# Patient Record
Sex: Female | Born: 1995 | Race: Black or African American | Hispanic: No | Smoking: Never smoker
Health system: Southern US, Community
[De-identification: ages and names within clinical notes are randomized; demographics above are authoritative.]

## PROBLEM LIST (undated history)

## (undated) DIAGNOSIS — J029 Acute pharyngitis, unspecified: Secondary | ICD-10-CM

## (undated) DIAGNOSIS — R079 Chest pain, unspecified: Secondary | ICD-10-CM

## (undated) DIAGNOSIS — M940 Chondrocostal junction syndrome [Tietze]: Secondary | ICD-10-CM

## (undated) DIAGNOSIS — J020 Streptococcal pharyngitis: Secondary | ICD-10-CM

## (undated) DIAGNOSIS — B354 Tinea corporis: Secondary | ICD-10-CM

## (undated) DIAGNOSIS — H6002 Abscess of left external ear: Secondary | ICD-10-CM

## (undated) DIAGNOSIS — R109 Unspecified abdominal pain: Secondary | ICD-10-CM

## (undated) DIAGNOSIS — R1013 Epigastric pain: Secondary | ICD-10-CM

## (undated) DIAGNOSIS — N946 Dysmenorrhea, unspecified: Secondary | ICD-10-CM

## (undated) DIAGNOSIS — M25519 Pain in unspecified shoulder: Secondary | ICD-10-CM

## (undated) DIAGNOSIS — R21 Rash and other nonspecific skin eruption: Secondary | ICD-10-CM

## (undated) DIAGNOSIS — R0789 Other chest pain: Secondary | ICD-10-CM

## (undated) DIAGNOSIS — R55 Syncope and collapse: Secondary | ICD-10-CM

## (undated) DIAGNOSIS — D508 Other iron deficiency anemias: Secondary | ICD-10-CM

## (undated) DIAGNOSIS — R519 Headache, unspecified: Secondary | ICD-10-CM

## (undated) DIAGNOSIS — K2900 Acute gastritis without bleeding: Secondary | ICD-10-CM

## (undated) HISTORY — DX: Rash and other nonspecific skin eruption: R21

## (undated) HISTORY — DX: Acute gastritis without bleeding: K29.00

## (undated) HISTORY — DX: Abscess of left external ear: H60.02

## (undated) HISTORY — DX: Epigastric pain: R10.13

## (undated) HISTORY — DX: Dysmenorrhea, unspecified: N94.6

## (undated) HISTORY — DX: Syncope and collapse: R55

## (undated) HISTORY — DX: Unspecified abdominal pain: R10.9

## (undated) HISTORY — DX: Chest pain, unspecified: R07.9

## (undated) HISTORY — DX: Chondrocostal junction syndrome (tietze): M94.0

## (undated) HISTORY — DX: Tinea corporis: B35.4

## (undated) HISTORY — DX: Other iron deficiency anemias: D50.8

## (undated) HISTORY — DX: Streptococcal pharyngitis: J02.0

## (undated) HISTORY — DX: Headache, unspecified: R51.9

## (undated) HISTORY — DX: Acute pharyngitis, unspecified: J02.9

## (undated) HISTORY — DX: Pain in unspecified shoulder: M25.519

## (undated) HISTORY — DX: Other chest pain: R07.89

---

## 2018-05-11 DIAGNOSIS — R1084 Generalized abdominal pain: Secondary | ICD-10-CM | POA: Diagnosis not present

## 2018-05-11 DIAGNOSIS — O26891 Other specified pregnancy related conditions, first trimester: Secondary | ICD-10-CM | POA: Diagnosis not present

## 2018-05-11 DIAGNOSIS — Z3A11 11 weeks gestation of pregnancy: Secondary | ICD-10-CM | POA: Diagnosis not present

## 2018-05-11 DIAGNOSIS — O98311 Other infections with a predominantly sexual mode of transmission complicating pregnancy, first trimester: Secondary | ICD-10-CM | POA: Diagnosis not present

## 2018-05-31 DIAGNOSIS — Z3A14 14 weeks gestation of pregnancy: Secondary | ICD-10-CM | POA: Diagnosis not present

## 2018-05-31 DIAGNOSIS — O3680X0 Pregnancy with inconclusive fetal viability, not applicable or unspecified: Secondary | ICD-10-CM | POA: Diagnosis not present

## 2018-05-31 DIAGNOSIS — Z3482 Encounter for supervision of other normal pregnancy, second trimester: Secondary | ICD-10-CM | POA: Diagnosis not present

## 2018-06-28 DIAGNOSIS — Z3482 Encounter for supervision of other normal pregnancy, second trimester: Secondary | ICD-10-CM | POA: Diagnosis not present

## 2018-06-28 DIAGNOSIS — Z3A18 18 weeks gestation of pregnancy: Secondary | ICD-10-CM | POA: Diagnosis not present

## 2018-07-13 DIAGNOSIS — R21 Rash and other nonspecific skin eruption: Secondary | ICD-10-CM | POA: Diagnosis not present

## 2018-07-13 DIAGNOSIS — L539 Erythematous condition, unspecified: Secondary | ICD-10-CM | POA: Diagnosis not present

## 2018-07-13 DIAGNOSIS — B354 Tinea corporis: Secondary | ICD-10-CM | POA: Diagnosis not present

## 2018-08-08 DIAGNOSIS — O26892 Other specified pregnancy related conditions, second trimester: Secondary | ICD-10-CM | POA: Diagnosis not present

## 2018-08-08 DIAGNOSIS — O2342 Unspecified infection of urinary tract in pregnancy, second trimester: Secondary | ICD-10-CM | POA: Diagnosis not present

## 2018-08-08 DIAGNOSIS — K5909 Other constipation: Secondary | ICD-10-CM | POA: Diagnosis not present

## 2018-08-08 DIAGNOSIS — R109 Unspecified abdominal pain: Secondary | ICD-10-CM | POA: Diagnosis not present

## 2018-08-08 DIAGNOSIS — B9689 Other specified bacterial agents as the cause of diseases classified elsewhere: Secondary | ICD-10-CM | POA: Diagnosis not present

## 2018-08-08 DIAGNOSIS — N760 Acute vaginitis: Secondary | ICD-10-CM | POA: Diagnosis not present

## 2018-08-08 DIAGNOSIS — Z3A24 24 weeks gestation of pregnancy: Secondary | ICD-10-CM | POA: Diagnosis not present

## 2018-08-11 DIAGNOSIS — B360 Pityriasis versicolor: Secondary | ICD-10-CM | POA: Diagnosis not present

## 2018-08-11 DIAGNOSIS — Z3A25 25 weeks gestation of pregnancy: Secondary | ICD-10-CM | POA: Diagnosis not present

## 2018-08-11 DIAGNOSIS — Z3482 Encounter for supervision of other normal pregnancy, second trimester: Secondary | ICD-10-CM | POA: Diagnosis not present

## 2018-08-18 DIAGNOSIS — H9201 Otalgia, right ear: Secondary | ICD-10-CM | POA: Diagnosis not present

## 2018-08-18 DIAGNOSIS — H6001 Abscess of right external ear: Secondary | ICD-10-CM | POA: Diagnosis not present

## 2018-08-23 DIAGNOSIS — Z3A26 26 weeks gestation of pregnancy: Secondary | ICD-10-CM | POA: Diagnosis not present

## 2018-08-23 DIAGNOSIS — Z3482 Encounter for supervision of other normal pregnancy, second trimester: Secondary | ICD-10-CM | POA: Diagnosis not present

## 2018-09-06 DIAGNOSIS — B373 Candidiasis of vulva and vagina: Secondary | ICD-10-CM | POA: Diagnosis not present

## 2018-09-06 DIAGNOSIS — O4693 Antepartum hemorrhage, unspecified, third trimester: Secondary | ICD-10-CM | POA: Diagnosis not present

## 2018-09-06 DIAGNOSIS — Z3483 Encounter for supervision of other normal pregnancy, third trimester: Secondary | ICD-10-CM | POA: Diagnosis not present

## 2018-09-06 DIAGNOSIS — R102 Pelvic and perineal pain: Secondary | ICD-10-CM | POA: Diagnosis not present

## 2018-09-06 DIAGNOSIS — O26899 Other specified pregnancy related conditions, unspecified trimester: Secondary | ICD-10-CM | POA: Diagnosis not present

## 2018-09-06 DIAGNOSIS — Z3482 Encounter for supervision of other normal pregnancy, second trimester: Secondary | ICD-10-CM | POA: Diagnosis not present

## 2018-09-06 DIAGNOSIS — O26893 Other specified pregnancy related conditions, third trimester: Secondary | ICD-10-CM | POA: Diagnosis not present

## 2018-09-11 DIAGNOSIS — R110 Nausea: Secondary | ICD-10-CM | POA: Diagnosis not present

## 2018-09-11 DIAGNOSIS — O210 Mild hyperemesis gravidarum: Secondary | ICD-10-CM | POA: Diagnosis not present

## 2018-09-11 DIAGNOSIS — R197 Diarrhea, unspecified: Secondary | ICD-10-CM | POA: Diagnosis not present

## 2018-09-11 DIAGNOSIS — Z3A28 28 weeks gestation of pregnancy: Secondary | ICD-10-CM | POA: Diagnosis not present

## 2018-09-11 DIAGNOSIS — R109 Unspecified abdominal pain: Secondary | ICD-10-CM | POA: Diagnosis not present

## 2018-09-11 DIAGNOSIS — R1030 Lower abdominal pain, unspecified: Secondary | ICD-10-CM | POA: Diagnosis not present

## 2018-09-11 DIAGNOSIS — O9989 Other specified diseases and conditions complicating pregnancy, childbirth and the puerperium: Secondary | ICD-10-CM | POA: Diagnosis not present

## 2018-09-11 DIAGNOSIS — O26893 Other specified pregnancy related conditions, third trimester: Secondary | ICD-10-CM | POA: Diagnosis not present

## 2018-09-11 DIAGNOSIS — E860 Dehydration: Secondary | ICD-10-CM | POA: Diagnosis not present

## 2018-09-11 DIAGNOSIS — Z87891 Personal history of nicotine dependence: Secondary | ICD-10-CM | POA: Diagnosis not present

## 2018-09-11 DIAGNOSIS — O219 Vomiting of pregnancy, unspecified: Secondary | ICD-10-CM | POA: Diagnosis not present

## 2018-09-28 DIAGNOSIS — Z3483 Encounter for supervision of other normal pregnancy, third trimester: Secondary | ICD-10-CM | POA: Diagnosis not present

## 2018-09-28 DIAGNOSIS — Z3A31 31 weeks gestation of pregnancy: Secondary | ICD-10-CM | POA: Diagnosis not present

## 2018-09-28 DIAGNOSIS — Z23 Encounter for immunization: Secondary | ICD-10-CM | POA: Diagnosis not present

## 2018-10-11 DIAGNOSIS — O283 Abnormal ultrasonic finding on antenatal screening of mother: Secondary | ICD-10-CM | POA: Diagnosis not present

## 2018-10-11 DIAGNOSIS — D509 Iron deficiency anemia, unspecified: Secondary | ICD-10-CM | POA: Diagnosis not present

## 2018-10-21 DIAGNOSIS — O9989 Other specified diseases and conditions complicating pregnancy, childbirth and the puerperium: Secondary | ICD-10-CM | POA: Diagnosis not present

## 2018-10-21 DIAGNOSIS — O99711 Diseases of the skin and subcutaneous tissue complicating pregnancy, first trimester: Secondary | ICD-10-CM | POA: Diagnosis not present

## 2018-10-21 DIAGNOSIS — L02211 Cutaneous abscess of abdominal wall: Secondary | ICD-10-CM | POA: Diagnosis not present

## 2018-10-21 DIAGNOSIS — Z3A01 Less than 8 weeks gestation of pregnancy: Secondary | ICD-10-CM | POA: Diagnosis not present

## 2018-10-25 DIAGNOSIS — Z3493 Encounter for supervision of normal pregnancy, unspecified, third trimester: Secondary | ICD-10-CM | POA: Diagnosis not present

## 2018-10-25 DIAGNOSIS — D509 Iron deficiency anemia, unspecified: Secondary | ICD-10-CM | POA: Diagnosis not present

## 2018-10-25 DIAGNOSIS — O99013 Anemia complicating pregnancy, third trimester: Secondary | ICD-10-CM | POA: Diagnosis not present

## 2018-11-01 DIAGNOSIS — Z3A00 Weeks of gestation of pregnancy not specified: Secondary | ICD-10-CM | POA: Diagnosis not present

## 2018-11-01 DIAGNOSIS — O99013 Anemia complicating pregnancy, third trimester: Secondary | ICD-10-CM | POA: Diagnosis not present

## 2018-11-01 DIAGNOSIS — D509 Iron deficiency anemia, unspecified: Secondary | ICD-10-CM | POA: Diagnosis not present

## 2018-11-03 HISTORY — PX: TUBAL LIGATION: SHX77

## 2018-11-08 DIAGNOSIS — Z3A37 37 weeks gestation of pregnancy: Secondary | ICD-10-CM | POA: Diagnosis not present

## 2018-11-08 DIAGNOSIS — Z3493 Encounter for supervision of normal pregnancy, unspecified, third trimester: Secondary | ICD-10-CM | POA: Diagnosis not present

## 2018-11-15 DIAGNOSIS — Z3483 Encounter for supervision of other normal pregnancy, third trimester: Secondary | ICD-10-CM | POA: Diagnosis not present

## 2018-11-15 DIAGNOSIS — Z3A38 38 weeks gestation of pregnancy: Secondary | ICD-10-CM | POA: Diagnosis not present

## 2018-11-15 DIAGNOSIS — D509 Iron deficiency anemia, unspecified: Secondary | ICD-10-CM | POA: Diagnosis not present

## 2018-11-15 DIAGNOSIS — O99013 Anemia complicating pregnancy, third trimester: Secondary | ICD-10-CM | POA: Diagnosis not present

## 2018-11-18 DIAGNOSIS — O99013 Anemia complicating pregnancy, third trimester: Secondary | ICD-10-CM | POA: Diagnosis not present

## 2018-11-18 DIAGNOSIS — D509 Iron deficiency anemia, unspecified: Secondary | ICD-10-CM | POA: Diagnosis not present

## 2018-11-19 DIAGNOSIS — Z302 Encounter for sterilization: Secondary | ICD-10-CM | POA: Diagnosis not present

## 2019-01-13 ENCOUNTER — Encounter: Payer: Self-pay | Admitting: Physician Assistant

## 2019-01-13 ENCOUNTER — Emergency Department
Admission: EM | Admit: 2019-01-13 | Discharge: 2019-01-13 | Disposition: A | Discharge status: Home / Self Care | Payer: Medicaid Other | Attending: Emergency Medicine | Admitting: Emergency Medicine

## 2019-01-13 DIAGNOSIS — J029 Acute pharyngitis, unspecified: Secondary | ICD-10-CM | POA: Diagnosis present

## 2019-01-13 DIAGNOSIS — J020 Streptococcal pharyngitis: Secondary | ICD-10-CM | POA: Insufficient documentation

## 2019-01-13 LAB — GROUP A STREP BY PCR: Group A Strep by PCR: DETECTED — AB

## 2019-01-13 MED ORDER — PENICILLIN G BENZATHINE 1200000 UNIT/2ML IM SUSP
1.2000 10*6.[IU] | Freq: Once | INTRAMUSCULAR | Status: AC
  Administered 2019-01-13: 1.2 10*6.[IU] via INTRAMUSCULAR
  Filled 2019-01-13: qty 2

## 2019-01-13 NOTE — ED Notes (Signed)
Presents with sore throat  States pain started couple of days ago  having increased pain with swallowing today  Unsure of fever  But afebrile on arrival

## 2019-01-13 NOTE — ED Provider Notes (Signed)
[facility] Emergency Department Provider Note ____________________________________________  Time seen: 0823  I have reviewed the triage vital signs and the nursing notes.  HISTORY  Chief Complaint  Sore Throat  HPI Sharon Day is a 23 y.o. female patient presents for evaluation and management of sore throat.  She reports that her mother was recently tested and treated for strep pharyngitis.  She reports onset since Monday.  She denies any fevers but reports pain with talking.  She is also had decreased appetite secondary to her symptoms.  History reviewed. No pertinent past medical history.  There are no active problems to display for this patient.  History reviewed. No pertinent surgical history.  Prior to Admission medications   Not on File    Allergies Patient has no known allergies.  No family history on file.  Social History Social History   Tobacco Use  . Smoking status: Not on file  Substance Use Topics  . Alcohol use: Not on file  . Drug use: Not on file    Review of Systems  Constitutional: Negative for fever. Eyes: Negative for visual changes. ENT: Positive for sore throat. Cardiovascular: Negative for chest pain. Respiratory: Negative for shortness of breath. Gastrointestinal: Negative for abdominal pain, vomiting and diarrhea. Genitourinary: Negative for dysuria. Musculoskeletal: Negative for back pain. Skin: Negative for rash. Neurological: Negative for headaches, focal weakness or numbness. ____________________________________________  PHYSICAL EXAM:  VITAL SIGNS: ED Triage Vitals  Enc Vitals Group     BP      Pulse      Resp      Temp      Temp src      SpO2      Weight      Height      Head Circumference      Peak Flow      Pain Score      Pain Loc      Pain Edu?      Excl. in GC?     Constitutional: Alert and oriented. Well appearing and in no distress. Head: Normocephalic and atraumatic. Eyes:  Conjunctivae are normal.  Normal extraocular movements Ears: Canals clear. TMs intact bilaterally. Nose: No congestion/rhinorrhea/epistaxis. Mouth/Throat: Mucous membranes are moist.  Uvula is midline and tonsils are flat.  There is mild oropharyngeal erythema appreciated. Neck: Supple. No thyromegaly. Hematological/Lymphatic/Immunological: Palpable anterior cervical lymphadenopathy. Cardiovascular: Normal rate, regular rhythm. Normal distal pulses. Respiratory: Normal respiratory effort. No wheezes/rales/rhonchi. Gastrointestinal: Soft and nontender. No distention. ____________________________________________   LABS (pertinent positives/negatives) Labs Reviewed  GROUP A STREP BY PCR - Abnormal; Notable for the following components:      Result Value   Group A Strep by PCR DETECTED (*)    All other components within normal limits  ____________________________________________  PROCEDURES  Procedures Penicillin G benzathine 1,200,000 units IM ____________________________________________  INITIAL IMPRESSION / ASSESSMENT AND PLAN / ED COURSE  Patient with ED evaluation of sore throat with a recent strep positive contact.  Her strep PCR confirms group A strep.  Patient was treated in the ED with a single dose of penicillin G.  She will continue to monitor and treat fevers and hydrate to prevent dehydration.  She is encouraged to follow with primary provider or return to the ED as needed.  Patient was treated and discharged during the epic downtime.  Paper chart was completed.  Generic discharge instructions were provided. ____________________________________________  FINAL CLINICAL IMPRESSION(S) / ED DIAGNOSES  Final diagnoses:  Strep pharyngitis  Lissa Hoard, PA-C 01/13/19 1244    Phineas Semen, MD 01/13/19 [phone]

## 2019-01-13 NOTE — ED Triage Notes (Signed)
See paper chart for computer downtime 

## 2019-06-29 DIAGNOSIS — Z5181 Encounter for therapeutic drug level monitoring: Secondary | ICD-10-CM | POA: Diagnosis not present

## 2019-07-06 DIAGNOSIS — Z5181 Encounter for therapeutic drug level monitoring: Secondary | ICD-10-CM | POA: Diagnosis not present

## 2019-07-14 DIAGNOSIS — Z5181 Encounter for therapeutic drug level monitoring: Secondary | ICD-10-CM | POA: Diagnosis not present

## 2019-07-21 DIAGNOSIS — Z5181 Encounter for therapeutic drug level monitoring: Secondary | ICD-10-CM | POA: Diagnosis not present

## 2019-07-28 DIAGNOSIS — Z5181 Encounter for therapeutic drug level monitoring: Secondary | ICD-10-CM | POA: Diagnosis not present

## 2019-08-03 DIAGNOSIS — Z5181 Encounter for therapeutic drug level monitoring: Secondary | ICD-10-CM | POA: Diagnosis not present

## 2019-08-09 ENCOUNTER — Emergency Department
Admission: EM | Admit: 2019-08-09 | Discharge: 2019-08-09 | Disposition: A | Discharge status: Home / Self Care | Payer: Medicaid Other | Attending: Emergency Medicine | Admitting: Emergency Medicine

## 2019-08-09 ENCOUNTER — Other Ambulatory Visit: Payer: Self-pay

## 2019-08-09 DIAGNOSIS — N946 Dysmenorrhea, unspecified: Secondary | ICD-10-CM | POA: Diagnosis not present

## 2019-08-09 DIAGNOSIS — R1084 Generalized abdominal pain: Secondary | ICD-10-CM | POA: Diagnosis present

## 2019-08-09 LAB — POCT PREGNANCY, URINE: Preg Test, Ur: NEGATIVE

## 2019-08-09 LAB — URINALYSIS, COMPLETE (UACMP) WITH MICROSCOPIC
Bacteria, UA: NONE SEEN
Bilirubin Urine: NEGATIVE
Glucose, UA: NEGATIVE mg/dL
Ketones, ur: NEGATIVE mg/dL
Leukocytes,Ua: NEGATIVE
Nitrite: NEGATIVE
Protein, ur: NEGATIVE mg/dL
RBC / HPF: >50 RBC/hpf — ABNORMAL HIGH (ref 0–5)
Specific Gravity, Urine: 1.015 (ref 1.005–1.030)
pH: 6 (ref 5.0–8.0)

## 2019-08-09 NOTE — ED Triage Notes (Signed)
Patient presents to ED via POV from home with c/o abdominal cramping. Patient is on her menstrual cycle. Patient reports she had a tubal ligation 9 months ago without complications. Denies N/V/D.

## 2019-08-09 NOTE — ED Provider Notes (Signed)
[facility] Emergency Department Provider Note ____________________________________________  Time seen: 1507  I have reviewed the triage vital signs and the nursing notes.  HISTORY  Chief Complaint  Abdominal Cramping  HPI Sharon Day is a 23 y.o. female presents to the ED accompanied by her significant other, for concern over abdominal cramping and vaginal bleeding.  Patient reports onset of her menstrual period yesterday, that she reports as  lighter than usual.  She gives a history of irregular menses, stating she may go several months with monthly cycles and then missed a month.  She reports her last missed menses was in April.  She notes that in January she had a uncomplicated tubal ligation following a normal spontaneous vaginal delivery.  She has had no complaints in the interim of any abnormal bleeding, pelvic pain, fever, chills, sweats.  She reports at this time some mild pelvic cramping that she describes as contractions.  She also reports some anterior thigh muscle pain and some chills.  She denies any fever, or sweats.  She denies any vaginal discharge or other complaints at this time she reports some concern for possible ectopic pregnancy and is here for pregnancy test.  No past medical history on file.  There are no active problems to display for this patient.   No past surgical history on file.  Prior to Admission medications   Not on File    Allergies Patient has no known allergies.  No family history on file.  Social History Social History   Tobacco Use  . Smoking status: Not on file  Substance Use Topics  . Alcohol use: Not on file  . Drug use: Not on file    Review of Systems  Constitutional: Negative for fever. Eyes: Negative for visual changes. ENT: Negative for sore throat. Cardiovascular: Negative for chest pain. Respiratory: Negative for shortness of breath. Gastrointestinal: Negative for abdominal pain, vomiting and  diarrhea. Genitourinary: Negative for dysuria.  Vaginal bleeding as above. Musculoskeletal: Negative for back pain. Skin: Negative for rash. Neurological: Negative for headaches, focal weakness or numbness. ____________________________________________  PHYSICAL EXAM:  VITAL SIGNS: ED Triage Vitals  Enc Vitals Group     BP 08/09/19 1430 125/83     Pulse Rate 08/09/19 1430 65     Resp 08/09/19 1430 15     Temp 08/09/19 1430 98.7 F (37.1 C)     Temp Source 08/09/19 1430 Oral     SpO2 08/09/19 1430 100 %     Weight 08/09/19 1431 113 lb (51.3 kg)     Height 08/09/19 1431 5\' 2"  (1.575 m)     Head Circumference --      Peak Flow --      Pain Score 08/09/19 1431 7     Pain Loc --      Pain Edu? --      Excl. in GC? --     Constitutional: Alert and oriented. Well appearing and in no distress. Head: Normocephalic and atraumatic. Eyes: Conjunctivae are normal. Normal extraocular movements Cardiovascular: Normal rate, regular rhythm. Normal distal pulses. Respiratory: Normal respiratory effort. No wheezes/rales/rhonchi. Gastrointestinal: Soft and nontender. No distention. GU: deferred Musculoskeletal: Nontender with normal range of motion in all extremities.  Neurologic:  Normal gait without ataxia. Normal speech and language. No gross focal neurologic deficits are appreciated. Skin:  Skin is warm, dry and intact. No rash noted. Psychiatric: Mood and affect are normal. Patient exhibits appropriate insight and judgment. ____________________________________________   LABS (pertinent positives/negatives) Labs  Reviewed  URINALYSIS, COMPLETE (UACMP) WITH MICROSCOPIC - Abnormal; Notable for the following components:      Result Value   Color, Urine YELLOW (*)    APPearance CLEAR (*)    Hgb urine dipstick LARGE (*)    RBC / HPF >50 (*)    All other components within normal limits  POC URINE PREG, ED  POCT PREGNANCY, URINE   ____________________________________________  PROCEDURES  Procedures ____________________________________________  INITIAL IMPRESSION / ASSESSMENT AND PLAN / ED COURSE  Patient with ED evaluation and concern of a possible pregnancy.  Patient with a normal exam at this time.  Her pregnancy test is negative and reassuring to her at this time.  She will follow-up with her primary provider or GYN provider for ongoing symptom management.  Sharon Day was evaluated in Emergency Department on 08/10/2019 for the symptoms described in the history of present illness. She was evaluated in the context of the global COVID-19 pandemic, which necessitated consideration that the patient might be at risk for infection with the SARS-CoV-2 virus that causes COVID-19. Institutional protocols and algorithms that pertain to the evaluation of patients at risk for COVID-19 are in a state of rapid change based on information released by regulatory bodies including the CDC and federal and state organizations. These policies and algorithms were followed during the patient's care in the ED. ____________________________________________  FINAL CLINICAL IMPRESSION(S) / ED DIAGNOSES  Final diagnoses:  Dysmenorrhea         Sharon Day, Dannielle Karvonen, PA-C 08/10/19 2306    Duffy Bruce, MD 08/12/19 1310

## 2019-08-09 NOTE — Discharge Instructions (Signed)
Your exam is normal and your pregnancy test is negative. You may take OTC ibuprofen for menstrual cramps and muscle pains. You should also consider downloading the app P-Tracker on your phone to keep track of your menstrual periods. Follow-up with your GYN provider or return as needed.

## 2019-08-09 NOTE — ED Notes (Signed)
See triage note   Presents with abd cramping  States she started her period yesterday and states she usually has cramping  But today she is having abd cramping with some back pain and thigh pain

## 2019-08-10 DIAGNOSIS — Z5181 Encounter for therapeutic drug level monitoring: Secondary | ICD-10-CM | POA: Diagnosis not present

## 2019-08-17 DIAGNOSIS — Z5181 Encounter for therapeutic drug level monitoring: Secondary | ICD-10-CM | POA: Diagnosis not present

## 2019-08-24 DIAGNOSIS — Z5181 Encounter for therapeutic drug level monitoring: Secondary | ICD-10-CM | POA: Diagnosis not present

## 2019-08-31 DIAGNOSIS — Z5181 Encounter for therapeutic drug level monitoring: Secondary | ICD-10-CM | POA: Diagnosis not present

## 2019-09-07 DIAGNOSIS — Z5181 Encounter for therapeutic drug level monitoring: Secondary | ICD-10-CM | POA: Diagnosis not present

## 2019-09-13 DIAGNOSIS — Z5181 Encounter for therapeutic drug level monitoring: Secondary | ICD-10-CM | POA: Diagnosis not present

## 2019-09-20 DIAGNOSIS — Z5181 Encounter for therapeutic drug level monitoring: Secondary | ICD-10-CM | POA: Diagnosis not present

## 2019-09-26 DIAGNOSIS — Z5181 Encounter for therapeutic drug level monitoring: Secondary | ICD-10-CM | POA: Diagnosis not present

## 2019-10-03 DIAGNOSIS — Z5181 Encounter for therapeutic drug level monitoring: Secondary | ICD-10-CM | POA: Diagnosis not present

## 2019-10-10 DIAGNOSIS — Z5181 Encounter for therapeutic drug level monitoring: Secondary | ICD-10-CM | POA: Diagnosis not present

## 2019-10-17 DIAGNOSIS — Z5181 Encounter for therapeutic drug level monitoring: Secondary | ICD-10-CM | POA: Diagnosis not present

## 2019-10-24 DIAGNOSIS — Z5181 Encounter for therapeutic drug level monitoring: Secondary | ICD-10-CM | POA: Diagnosis not present

## 2019-10-31 DIAGNOSIS — Z5181 Encounter for therapeutic drug level monitoring: Secondary | ICD-10-CM | POA: Diagnosis not present

## 2019-11-07 DIAGNOSIS — Z5181 Encounter for therapeutic drug level monitoring: Secondary | ICD-10-CM | POA: Diagnosis not present

## 2019-11-14 DIAGNOSIS — Z5181 Encounter for therapeutic drug level monitoring: Secondary | ICD-10-CM | POA: Diagnosis not present

## 2019-11-21 DIAGNOSIS — Z5181 Encounter for therapeutic drug level monitoring: Secondary | ICD-10-CM | POA: Diagnosis not present

## 2019-11-28 DIAGNOSIS — Z5181 Encounter for therapeutic drug level monitoring: Secondary | ICD-10-CM | POA: Diagnosis not present

## 2019-12-05 DIAGNOSIS — Z5181 Encounter for therapeutic drug level monitoring: Secondary | ICD-10-CM | POA: Diagnosis not present

## 2019-12-14 ENCOUNTER — Encounter (HOSPITAL_COMMUNITY): Payer: Self-pay | Admitting: Emergency Medicine

## 2019-12-14 ENCOUNTER — Emergency Department (HOSPITAL_COMMUNITY)
Admission: EM | Admit: 2019-12-14 | Discharge: 2019-12-14 | Disposition: A | Discharge status: Home / Self Care | Payer: Medicaid Other | Attending: Emergency Medicine | Admitting: Emergency Medicine

## 2019-12-14 ENCOUNTER — Other Ambulatory Visit: Payer: Self-pay

## 2019-12-14 ENCOUNTER — Emergency Department (HOSPITAL_COMMUNITY): Payer: Medicaid Other

## 2019-12-14 DIAGNOSIS — M79645 Pain in left finger(s): Secondary | ICD-10-CM | POA: Diagnosis not present

## 2019-12-14 DIAGNOSIS — R1030 Lower abdominal pain, unspecified: Secondary | ICD-10-CM | POA: Insufficient documentation

## 2019-12-14 DIAGNOSIS — N760 Acute vaginitis: Secondary | ICD-10-CM | POA: Insufficient documentation

## 2019-12-14 DIAGNOSIS — B9689 Other specified bacterial agents as the cause of diseases classified elsewhere: Secondary | ICD-10-CM | POA: Diagnosis not present

## 2019-12-14 DIAGNOSIS — Z202 Contact with and (suspected) exposure to infections with a predominantly sexual mode of transmission: Secondary | ICD-10-CM | POA: Insufficient documentation

## 2019-12-14 DIAGNOSIS — R109 Unspecified abdominal pain: Secondary | ICD-10-CM

## 2019-12-14 DIAGNOSIS — R1032 Left lower quadrant pain: Secondary | ICD-10-CM | POA: Diagnosis not present

## 2019-12-14 DIAGNOSIS — Z113 Encounter for screening for infections with a predominantly sexual mode of transmission: Secondary | ICD-10-CM | POA: Diagnosis not present

## 2019-12-14 DIAGNOSIS — S60012A Contusion of left thumb without damage to nail, initial encounter: Secondary | ICD-10-CM | POA: Diagnosis not present

## 2019-12-14 LAB — COMPREHENSIVE METABOLIC PANEL
ALT: 16 U/L (ref 0–44)
AST: 17 U/L (ref 15–41)
Albumin: 4 g/dL (ref 3.5–5.0)
Alkaline Phosphatase: 44 U/L (ref 38–126)
Anion gap: 8 (ref 5–15)
BUN: 8 mg/dL (ref 6–20)
CO2: 26 mmol/L (ref 22–32)
Calcium: 9.1 mg/dL (ref 8.9–10.3)
Chloride: 104 mmol/L (ref 98–111)
Creatinine, Ser: 0.78 mg/dL (ref 0.44–1.00)
GFR calc Af Amer: >60 mL/min (ref >60)
GFR calc non Af Amer: >60 mL/min (ref >60)
Glucose, Bld: 103 mg/dL — ABNORMAL HIGH (ref 70–99)
Potassium: 3.5 mmol/L (ref 3.5–5.1)
Sodium: 138 mmol/L (ref 135–145)
Total Bilirubin: 0.5 mg/dL (ref 0.3–1.2)
Total Protein: 6.6 g/dL (ref 6.5–8.1)

## 2019-12-14 LAB — URINALYSIS, ROUTINE W REFLEX MICROSCOPIC
Bilirubin Urine: NEGATIVE
Glucose, UA: NEGATIVE mg/dL
Hgb urine dipstick: NEGATIVE
Ketones, ur: NEGATIVE mg/dL
Leukocytes,Ua: NEGATIVE
Nitrite: NEGATIVE
Protein, ur: 100 mg/dL — AB
Specific Gravity, Urine: 1.033 — ABNORMAL HIGH (ref 1.005–1.030)
pH: 5 (ref 5.0–8.0)

## 2019-12-14 LAB — RAPID HIV SCREEN (HIV 1/2 AB+AG)
HIV 1/2 Antibodies: NONREACTIVE
HIV-1 P24 Antigen - HIV24: REACTIVE — AB

## 2019-12-14 LAB — CBC
HCT: 38.2 % (ref 36.0–46.0)
Hemoglobin: 12.5 g/dL (ref 12.0–15.0)
MCH: 30 pg (ref 26.0–34.0)
MCHC: 32.7 g/dL (ref 30.0–36.0)
MCV: 91.8 fL (ref 80.0–100.0)
Platelets: 195 10*3/uL (ref 150–400)
RBC: 4.16 MIL/uL (ref 3.87–5.11)
RDW: 12.1 % (ref 11.5–15.5)
WBC: 5.6 10*3/uL (ref 4.0–10.5)
nRBC: 0 % (ref 0.0–0.2)

## 2019-12-14 LAB — WET PREP, GENITAL
Sperm: NONE SEEN
Trich, Wet Prep: NONE SEEN
Yeast Wet Prep HPF POC: NONE SEEN

## 2019-12-14 LAB — I-STAT BETA HCG BLOOD, ED (MC, WL, AP ONLY): I-stat hCG, quantitative: <5 m[IU]/mL (ref <5)

## 2019-12-14 LAB — LIPASE, BLOOD: Lipase: 33 U/L (ref 11–51)

## 2019-12-14 MED ORDER — ALUM & MAG HYDROXIDE-SIMETH 200-200-20 MG/5ML PO SUSP
30.0000 mL | Freq: Once | ORAL | Status: AC
  Administered 2019-12-14: 21:00:00 30 mL via ORAL
  Filled 2019-12-14: qty 30

## 2019-12-14 MED ORDER — METRONIDAZOLE 500 MG PO TABS: 500.0000 mg | ORAL_TABLET | Freq: Two times a day (BID) | ORAL | 0 refills | Status: AC

## 2019-12-14 MED ORDER — KETOROLAC TROMETHAMINE 30 MG/ML IJ SOLN
30.0000 mg | Freq: Once | INTRAMUSCULAR | Status: AC
  Administered 2019-12-14: 21:00:00 30 mg via INTRAVENOUS
  Filled 2019-12-14: qty 1

## 2019-12-14 MED ORDER — SODIUM CHLORIDE 0.9% FLUSH
3.0000 mL | Freq: Once | INTRAVENOUS | Status: AC
  Administered 2019-12-14: 21:00:00 3 mL via INTRAVENOUS

## 2019-12-14 NOTE — ED Triage Notes (Signed)
Pt c/o lower abdominal pain and pain with urination x 2 days. Denies abnormal vaginal discharge or bleeding.

## 2019-12-14 NOTE — ED Notes (Signed)
Off to xray

## 2019-12-14 NOTE — Discharge Instructions (Addendum)
You were given a prescription for antibiotics. Please take the antibiotic prescription fully. Do not drink alcohol while taking this medication as it will make you very sick.  You have been tested for HIV, syphilis, chlamydia and gonorrhea.  These results will be available in approximately 3 days and you will be contacted by the hospital if the results are positive. Avoid sexual contact until you are aware of the results, and please inform all sexual partners if you test positive for any of these diseases.  Please follow up with your primary doctor within the next 5-7 days.  If you do not have a primary care provider, information for a healthcare clinic has been provided for you to make arrangements for follow up care. Please return to the ER sooner if you have any new or worsening symptoms, or if you have any of the following symptoms:  Abdominal pain that does not go away.  You have a fever.  You keep throwing up (vomiting).  The pain is felt only in portions of the abdomen. Pain in the right side could possibly be appendicitis. In an adult, pain in the left lower portion of the abdomen could be colitis or diverticulitis.  You pass bloody or black tarry stools.  There is bright red blood in the stool.  The constipation stays for more than 4 days.  There is belly (abdominal) or rectal pain.  You do not seem to be getting better.  You have any questions or concerns.

## 2019-12-14 NOTE — ED Provider Notes (Signed)
[place] EMERGENCY DEPARTMENT Provider Note   CSN: [id number] Arrival date & time: 12/14/19  1833     History Chief Complaint  Patient presents with  . Abdominal Pain    Sharon Day is a 24 y.o. female.  HPI   Pt is a 24 y/o female who presents to the ED today for eval of mult complaints including abd pain and finger pain.  Abd pain: abd pain that started 2 days ago. Pain is located to the lower abd. Currently she rates pain 7/10. Pain feels sharp in nature. Pain is intermittent and seems be positional in nature. Pain is worse when she sits up for too long or when she lays a certain way. Pain seems to last about 20-30 minutes at a time. She reports some dysuria, frequency. Denies hematuria. Denies abnormal vaginal discharge or bleeding. She is currently sexually active with one female partner. She is requesting std testing. States tylenol has been helping sxs temporarily.  Finger pain: She states she smashed her left thumb in a car door yesterday morning. Pain started suddenly and it has been constant since onset.   History reviewed. No pertinent past medical history.  There are no problems to display for this patient.   History reviewed. No pertinent surgical history.   OB History   No obstetric history on file.     No family history on file.  Social History   Tobacco Use  . Smoking status: Never Smoker  . Smokeless tobacco: Never Used  Substance Use Topics  . Alcohol use: Yes  . Drug use: Never    Home Medications Prior to Admission medications   Medication Sig Start Date End Date Taking? Authorizing Provider  metroNIDAZOLE (FLAGYL) 500 MG tablet Take 1 tablet (500 mg total) by mouth 2 (two) times daily for 7 days. 12/14/19 12/21/19  Nohelani Benning S, PA-C    Allergies    Patient has no known allergies.  Review of Systems   Review of Systems  Constitutional: Negative for chills and fever.  HENT: Negative for ear pain and sore throat.     Eyes: Negative for visual disturbance.  Respiratory: Negative for cough and shortness of breath.   Cardiovascular: Negative for chest pain.  Gastrointestinal: Positive for abdominal pain and diarrhea. Negative for constipation, nausea and vomiting.  Genitourinary: Positive for dysuria and frequency. Negative for hematuria and urgency.  Musculoskeletal: Negative for arthralgias and back pain.  Skin: Negative for color change and rash.  Neurological: Negative for headaches.  All other systems reviewed and are negative.   Physical Exam Updated Vital Signs BP 119/77   Pulse 64   Temp 98.1 F (36.7 C) (Oral)   Resp 16   LMP 11/29/2019   SpO2 100%   Physical Exam Vitals and nursing note reviewed.  Constitutional:      General: She is not in acute distress.    Appearance: She is well-developed.     Comments: No distress  HENT:     Head: Normocephalic and atraumatic.  Eyes:     Conjunctiva/sclera: Conjunctivae normal.  Cardiovascular:     Rate and Rhythm: Normal rate and regular rhythm.     Heart sounds: Normal heart sounds. No murmur.  Pulmonary:     Effort: Pulmonary effort is normal. No respiratory distress.     Breath sounds: Normal breath sounds. No wheezing, rhonchi or rales.  Abdominal:     General: Bowel sounds are normal.     Palpations: Abdomen is soft.  Tenderness: There is abdominal tenderness (mild) in the periumbilical area. There is no right CVA tenderness, left CVA tenderness, guarding or rebound.  Musculoskeletal:     Cervical back: Neck supple.     Comments: Small subungual hematoma with overlying tenderness.  Normal range of motion and sensation to the thumb.  Brisk cap refill distally.  Skin:    General: Skin is warm and dry.  Neurological:     Mental Status: She is alert.     ED Results / Procedures / Treatments   Labs (all labs ordered are listed, but only abnormal results are displayed) Labs Reviewed  WET PREP, GENITAL - Abnormal; Notable for  the following components:      Result Value   Clue Cells Wet Prep HPF POC PRESENT (*)    WBC, Wet Prep HPF POC MODERATE (*)    All other components within normal limits  COMPREHENSIVE METABOLIC PANEL - Abnormal; Notable for the following components:   Glucose, Bld 103 (*)    All other components within normal limits  URINALYSIS, ROUTINE W REFLEX MICROSCOPIC - Abnormal; Notable for the following components:   APPearance HAZY (*)    Specific Gravity, Urine 1.033 (*)    Protein, ur 100 (*)    Bacteria, UA RARE (*)    All other components within normal limits  URINE CULTURE  LIPASE, BLOOD  CBC  HIV ANTIBODY (ROUTINE TESTING W REFLEX)  RAPID HIV SCREEN (HIV 1/2 AB+AG)  I-STAT BETA HCG BLOOD, ED ([facility], [facility], [facility] ONLY)  GC/CHLAMYDIA PROBE AMP ([place]) NOT AT [facility]    EKG None  Radiology DG Finger Thumb Left  Result Date: 12/14/2019 CLINICAL DATA:  Pt c/o distal left thumb pain and bruising after slamming her thumb in a car door today. No hx of prior injuries or surgeries to the area. EXAM: LEFT THUMB 2+V COMPARISON:  None. FINDINGS: There is no evidence of fracture or dislocation. There is no evidence of arthropathy or other focal bone abnormality. Soft tissues are unremarkable. IMPRESSION: Negative radiographs of the left thumb. Electronically Signed   By: Emmaline Kluver M.D.   On: 12/14/2019 21:13    Procedures Procedures (including critical care time)  Medications Ordered in ED Medications  sodium chloride flush (NS) 0.9 % injection 3 mL (3 mLs Intravenous Given 12/14/19 2126)  ketorolac (TORADOL) 30 MG/ML injection 30 mg (30 mg Intravenous Given 12/14/19 2126)  alum & mag hydroxide-simeth (MAALOX/MYLANTA) 200-200-20 MG/5ML suspension 30 mL (30 mLs Oral Given 12/14/19 2126)    ED Course  I have reviewed the triage vital signs and the nursing notes.  Pertinent labs & imaging results that were available during my care of the patient were reviewed by me and considered in my  medical decision making (see chart for details).    MDM Rules/Calculators/A&P                      24 year old female presenting for periumbilical abdominal pain x2 days.  Vital signs within normal limits.  Nontoxic, nonseptic appearing.  Mild abdominal tenderness on exam without peritoneal signs.  Reviewed labs CBC without leukocytosis or anemia CMP with normal electrolytes kidney and liver function Lipase negative UA without any signs of UTI  - culture sent since pt is having some sxs Beta-hCG negative  Pelvic exam with mild uterine TTP Wet prep with clue cells and wbc's HIV, RPR, GC/chlamydia obtained and pending at the time of discharge  X-ray left thumb without evidence of acute  fracture or abnormality.  Pt given a dose of toradol in the ED. On reassessment she states her pain is resolved. Repeat abd exam benign. Patient is nontoxic, nonseptic appearing, in no apparent distress.  Patient's pain and other symptoms adequately managed in emergency department.   Labs, and vitals reviewed and w/u reassuring.  Patient does not meet the SIRS or Sepsis criteria.  No indication of appendicitis, bowel obstruction, bowel perforation, cholecystitis, diverticulitis, PID or ectopic pregnancy. Will tx for bacterial vaginosis. Patient discharged home and given strict instructions for follow-up with their primary care physician.  I have also discussed reasons to return immediately to the ER.  Patient expresses understanding and agrees with plan.  Final Clinical Impression(s) / ED Diagnoses Final diagnoses:  Abdominal pain, unspecified abdominal location  Encounter for screening examination for sexually transmitted disease  Bacterial vaginosis  Pain of left thumb    Rx / DC Orders ED Discharge Orders         Ordered    metroNIDAZOLE (FLAGYL) 500 MG tablet  2 times daily     12/14/19 2234           Karrie Meres, PA-C 12/14/19 2237    Arby Barrette, MD 12/16/19 1542

## 2019-12-16 LAB — GC/CHLAMYDIA PROBE AMP (~~LOC~~) NOT AT ARMC
Chlamydia: NEGATIVE
Neisseria Gonorrhea: NEGATIVE

## 2019-12-16 LAB — URINE CULTURE: Culture: NO GROWTH

## 2019-12-18 LAB — HIV-1/2 AB - DIFFERENTIATION
HIV 1 Ab: NEGATIVE
HIV 2 Ab: NEGATIVE
Note: NEGATIVE

## 2019-12-18 LAB — RNA QUALITATIVE: HIV 1 RNA Qualitative: 1

## 2020-01-06 ENCOUNTER — Other Ambulatory Visit: Payer: Self-pay

## 2020-01-06 ENCOUNTER — Emergency Department (HOSPITAL_COMMUNITY)
Admission: EM | Admit: 2020-01-06 | Discharge: 2020-01-06 | Disposition: A | Discharge status: Home / Self Care | Payer: Medicaid Other | Attending: Emergency Medicine | Admitting: Emergency Medicine

## 2020-01-06 ENCOUNTER — Encounter (HOSPITAL_COMMUNITY): Payer: Self-pay | Admitting: *Deleted

## 2020-01-06 DIAGNOSIS — H6002 Abscess of left external ear: Secondary | ICD-10-CM

## 2020-01-06 DIAGNOSIS — H9202 Otalgia, left ear: Secondary | ICD-10-CM | POA: Diagnosis present

## 2020-01-06 MED ORDER — LIDOCAINE HCL (PF) 1 % IJ SOLN
5.0000 mL | Freq: Once | INTRAMUSCULAR | Status: AC
  Administered 2020-01-06: 5 mL
  Filled 2020-01-06: qty 5

## 2020-01-06 NOTE — ED Triage Notes (Signed)
The pt is c/o an abscess in her lt ear Wednesday  lmp 02-26

## 2020-01-06 NOTE — ED Provider Notes (Signed)
[facility] EMERGENCY DEPARTMENT Provider Note   CSN: [id number] Arrival date & time: 01/06/20  2221     History Chief Complaint  Patient presents with  . Otalgia    Sharon Day is a 24 y.o. female.  Patient to ED with complaint of painful swelling in her left ear x 3 days. 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 No drainage, fever, sore throat or headache. No nausea.   The history is provided by the patient. No language interpreter was used.  Otalgia Associated symptoms: no fever, no headaches and no sore throat        History reviewed. No pertinent past medical history.  There are no problems to display for this patient.   History reviewed. No pertinent surgical history.   OB History   No obstetric history on file.     No family history on file.  Social History   Tobacco Use  . Smoking status: Never Smoker  . Smokeless tobacco: Never Used  Substance Use Topics  . Alcohol use: Yes  . Drug use: Never    Home Medications Prior to Admission medications   Not on File    Allergies    Patient has no known allergies.  Review of Systems   Review of Systems  Constitutional: Negative for fever.  HENT: Positive for ear pain. Negative for sore throat and trouble swallowing.   Gastrointestinal: Negative for nausea.  Neurological: Negative for headaches.    Physical Exam Updated Vital Signs Ht 5\' 2"  (1.575 m)   Wt 51.3 kg   LMP 12/30/2019   BMI 20.69 kg/m   Physical Exam Constitutional:      General: She is not in acute distress. HENT:     Head: Atraumatic.     Ears:     Comments: There is a fluctuant, erythematous, tender, swollen area at the left external canal meatus. No active drainage. No pre- or post-auricular lymphadenopathy.  Eyes:     Extraocular Movements: Extraocular movements intact.  Pulmonary:     Effort: Pulmonary effort is  normal.  Musculoskeletal:     Cervical back: Normal range of motion.  Skin:    General: Skin is warm and dry.  Neurological:     Mental Status: She is alert.     ED Results / Procedures / Treatments   Labs (all labs ordered are listed, but only abnormal results are displayed) Labs Reviewed - No data to display  EKG None  Radiology No results found.  Procedures .Marland KitchenIncision and Drainage  Date/Time: 01/06/2020 11:03 PM Performed by: Charlann Lange, PA-C Authorized by: Charlann Lange, PA-C   Consent:    Consent obtained:  Verbal   Consent given by:  Patient Location:    Type:  Abscess   Location:  Head   Head location:  L external ear Pre-procedure details:    Skin preparation:  Betadine Anesthesia (see MAR for exact dosages):    Anesthesia method:  Local infiltration   Local anesthetic:  Lidocaine 1% w/o epi Procedure type:    Complexity:  Simple Procedure details:    Needle aspiration: no     Incision types:  Stab incision   Scalpel blade:  11   Drainage:  Purulent   Drainage amount:  Scant   Wound treatment:  Wound left open Post-procedure details:    Patient tolerance of procedure:  Tolerated well, no immediate complications   (  including critical care time)  Medications Ordered in ED Medications  lidocaine (PF) (XYLOCAINE) 1 % injection 5 mL (has no administration in time range)    ED Course  I have reviewed the triage vital signs and the nursing notes.  Pertinent labs & imaging results that were available during my care of the patient were reviewed by me and considered in my medical decision making (see chart for details).    MDM Rules/Calculators/A&P                     Uncomplicated cutaneous abscess left external ear, opened and drained as per above note.   1. Cutaneous abscess, left ear  Final Clinical Impression(s) / ED Diagnoses Final diagnoses:  None    Rx / DC Orders ED Discharge Orders    None       Danne Harbor 01/06/20 2324    Lorre Nick, MD 01/10/20 1443

## 2020-01-06 NOTE — Discharge Instructions (Addendum)
Tylenol and/or ibuprofen for discomfort. Apply warm compresses as directed.   If you develop a fever, have recurrent/worsening swelling, redness or new concern, please return to the emergency department for re-evaluation.

## 2020-03-13 DIAGNOSIS — Z13220 Encounter for screening for lipoid disorders: Secondary | ICD-10-CM | POA: Diagnosis not present

## 2020-03-13 DIAGNOSIS — Z113 Encounter for screening for infections with a predominantly sexual mode of transmission: Secondary | ICD-10-CM | POA: Diagnosis not present

## 2020-03-13 DIAGNOSIS — Z0000 Encounter for general adult medical examination without abnormal findings: Secondary | ICD-10-CM | POA: Diagnosis not present

## 2020-03-13 DIAGNOSIS — D509 Iron deficiency anemia, unspecified: Secondary | ICD-10-CM | POA: Diagnosis not present

## 2020-03-13 DIAGNOSIS — B9689 Other specified bacterial agents as the cause of diseases classified elsewhere: Secondary | ICD-10-CM | POA: Diagnosis not present

## 2020-03-13 DIAGNOSIS — N946 Dysmenorrhea, unspecified: Secondary | ICD-10-CM | POA: Diagnosis not present

## 2020-03-13 DIAGNOSIS — N760 Acute vaginitis: Secondary | ICD-10-CM | POA: Diagnosis not present

## 2020-03-13 DIAGNOSIS — B373 Candidiasis of vulva and vagina: Secondary | ICD-10-CM | POA: Diagnosis not present

## 2020-05-31 ENCOUNTER — Encounter (HOSPITAL_COMMUNITY): Payer: Self-pay

## 2020-05-31 ENCOUNTER — Emergency Department (HOSPITAL_COMMUNITY)
Admission: EM | Admit: 2020-05-31 | Discharge: 2020-05-31 | Disposition: A | Discharge status: Home / Self Care | Payer: Medicaid Other | Attending: Emergency Medicine | Admitting: Emergency Medicine

## 2020-05-31 ENCOUNTER — Emergency Department (HOSPITAL_COMMUNITY): Payer: Medicaid Other

## 2020-05-31 DIAGNOSIS — R079 Chest pain, unspecified: Secondary | ICD-10-CM | POA: Diagnosis not present

## 2020-05-31 DIAGNOSIS — R0789 Other chest pain: Secondary | ICD-10-CM | POA: Diagnosis not present

## 2020-05-31 LAB — CBC
HCT: 37.8 % (ref 36.0–46.0)
Hemoglobin: 12 g/dL (ref 12.0–15.0)
MCH: 29.3 pg (ref 26.0–34.0)
MCHC: 31.7 g/dL (ref 30.0–36.0)
MCV: 92.2 fL (ref 80.0–100.0)
Platelets: 210 10*3/uL (ref 150–400)
RBC: 4.1 MIL/uL (ref 3.87–5.11)
RDW: 12.9 % (ref 11.5–15.5)
WBC: 5.1 10*3/uL (ref 4.0–10.5)
nRBC: 0 % (ref 0.0–0.2)

## 2020-05-31 LAB — TROPONIN I (HIGH SENSITIVITY): Troponin I (High Sensitivity): <2 ng/L (ref <18)

## 2020-05-31 LAB — BASIC METABOLIC PANEL
Anion gap: 8 (ref 5–15)
BUN: 11 mg/dL (ref 6–20)
CO2: 24 mmol/L (ref 22–32)
Calcium: 9.1 mg/dL (ref 8.9–10.3)
Chloride: 105 mmol/L (ref 98–111)
Creatinine, Ser: 0.83 mg/dL (ref 0.44–1.00)
GFR calc Af Amer: >60 mL/min (ref >60)
GFR calc non Af Amer: >60 mL/min (ref >60)
Glucose, Bld: 72 mg/dL (ref 70–99)
Potassium: 3.6 mmol/L (ref 3.5–5.1)
Sodium: 137 mmol/L (ref 135–145)

## 2020-05-31 LAB — I-STAT BETA HCG BLOOD, ED (MC, WL, AP ONLY): I-stat hCG, quantitative: <5 m[IU]/mL (ref <5)

## 2020-05-31 MED ORDER — ACETAMINOPHEN 325 MG PO TABS: 650.0000 mg | ORAL_TABLET | Freq: Four times a day (QID) | ORAL | Status: DC | PRN

## 2020-05-31 MED ORDER — SODIUM CHLORIDE 0.9% FLUSH: 3.0000 mL | Freq: Once | INTRAVENOUS | Status: DC

## 2020-05-31 NOTE — ED Triage Notes (Signed)
Patient presents to the ED with R sided chest pain that started today. +lightheadedness. Worsens with deep breathing. Denies N/V. Patient appears in no acute distress, respirs even and unlabored. Skin warm and dry.

## 2020-05-31 NOTE — ED Provider Notes (Signed)
[facility] EMERGENCY DEPARTMENT Provider Note   CSN: [id number] Arrival date & time: 05/31/20  1422     History Chief Complaint  Patient presents with  . Chest Pain    Sharon Day is a 24 y.o. female.  The history is provided by the patient. No language interpreter was used.  Chest Pain Pain location:  R chest Pain quality: aching   Pain radiates to:  Does not radiate Pain severity:  Moderate Onset quality:  Gradual Timing:  Constant Progression:  Worsening Chronicity:  New Relieved by:  Nothing Worsened by:  Nothing Ineffective treatments:  None tried Associated symptoms: no heartburn   Risk factors: no high cholesterol and no hypertension        History reviewed. No pertinent past medical history.  There are no problems to display for this patient.   History reviewed. No pertinent surgical history.   OB History   No obstetric history on file.     No family history on file.  Social History   Tobacco Use  . Smoking status: Never Smoker  . Smokeless tobacco: Never Used  Substance Use Topics  . Alcohol use: Yes  . Drug use: Never    Home Medications Prior to Admission medications   Medication Sig Start Date End Date Taking? Authorizing Provider  acetaminophen (TYLENOL) 500 MG tablet Take 500 mg by mouth every 6 (six) hours as needed for mild pain.    Yes [provider]    Allergies    Patient has no known allergies.  Review of Systems   Review of Systems  Cardiovascular: Positive for chest pain.  Gastrointestinal: Negative for heartburn.  All other systems reviewed and are negative.   Physical Exam Updated Vital Signs BP (!) 122/87 (BP Location: Right Arm) Comment: Simultaneous filing. User may not have seen previous data.  Pulse 66 Comment: Simultaneous filing. User may not have seen previous data.  Temp 98.9 F (37.2 C) (Oral) Comment: Simultaneous filing. User may not have seen previous data. Comment (Src):  Simultaneous filing. User may not have seen previous data.  Resp 16 Comment: Simultaneous filing. User may not have seen previous data.  Ht 5\' 2"  (1.575 m)   Wt 51.3 kg   LMP 04/28/2020   SpO2 100% Comment: Simultaneous filing. User may not have seen previous data.  BMI 20.67 kg/m   Physical Exam Vitals and nursing note reviewed.  Eyes:     Pupils: Pupils are equal, round, and reactive to light.  Cardiovascular:     Rate and Rhythm: Normal rate and regular rhythm.     Heart sounds: Normal heart sounds.  Pulmonary:     Effort: Pulmonary effort is normal.     Breath sounds: Normal breath sounds.  Abdominal:     Palpations: Abdomen is soft.  Musculoskeletal:        General: Normal range of motion.     Cervical back: Normal range of motion.  Skin:    General: Skin is warm.  Neurological:     General: No focal deficit present.     Mental Status: She is alert.  Psychiatric:        Mood and Affect: Mood normal.     ED Results / Procedures / Treatments   Labs (all labs ordered are listed, but only abnormal results are displayed) Labs Reviewed  BASIC METABOLIC PANEL  CBC  I-STAT BETA HCG BLOOD, ED ([facility], [facility], [facility] ONLY)  TROPONIN I (HIGH SENSITIVITY)  TROPONIN I (HIGH  SENSITIVITY)    EKG None  Radiology DG Chest 2 View  Result Date: 05/31/2020 CLINICAL DATA:  Chest pain. EXAM: CHEST - 2 VIEW COMPARISON:  None. FINDINGS: The heart size and mediastinal contours are within normal limits. Both lungs are clear. No pneumothorax or pleural effusion is noted. The visualized skeletal structures are unremarkable. IMPRESSION: No active cardiopulmonary disease. Electronically Signed   By: Lupita Raider M.D.   On: 05/31/2020 15:41    Procedures Procedures (including critical care time)  Medications Ordered in ED Medications  sodium chloride flush (NS) 0.9 % injection 3 mL (has no administration in time range)    ED Course  I have reviewed the triage vital signs and the nursing  notes.  Pertinent labs & imaging results that were available during my care of the patient were reviewed by me and considered in my medical decision making (see chart for details).    MDM Rules/Calculators/A&P                          MDM: Pt has some tenderness to palpation.  I suspect muscular pain Final Clinical Impression(s) / ED Diagnoses Final diagnoses:  Chest wall pain    Rx / DC Orders ED Discharge Orders    None    An After Visit Summary was printed and given to the patient.    Elson Areas, Cordelia Poche 05/31/20 1741    Charlynne Pander, MD 05/31/20 [phone]

## 2020-05-31 NOTE — Discharge Instructions (Addendum)
Return if any problems.

## 2020-07-12 DIAGNOSIS — Z20822 Contact with and (suspected) exposure to covid-19: Secondary | ICD-10-CM | POA: Diagnosis not present

## 2020-12-02 ENCOUNTER — Emergency Department (HOSPITAL_COMMUNITY): Payer: Medicaid Other

## 2020-12-02 ENCOUNTER — Encounter (HOSPITAL_COMMUNITY): Payer: Self-pay | Admitting: Emergency Medicine

## 2020-12-02 ENCOUNTER — Other Ambulatory Visit: Payer: Self-pay

## 2020-12-02 ENCOUNTER — Emergency Department (HOSPITAL_COMMUNITY)
Admission: EM | Admit: 2020-12-02 | Discharge: 2020-12-02 | Disposition: A | Discharge status: Home / Self Care | Payer: Medicaid Other | Attending: Emergency Medicine | Admitting: Emergency Medicine

## 2020-12-02 DIAGNOSIS — R519 Headache, unspecified: Secondary | ICD-10-CM | POA: Diagnosis not present

## 2020-12-02 DIAGNOSIS — Y92410 Unspecified street and highway as the place of occurrence of the external cause: Secondary | ICD-10-CM | POA: Insufficient documentation

## 2020-12-02 MED ORDER — LIDOCAINE 5 % EX PTCH
1.0000 | MEDICATED_PATCH | Freq: Once | CUTANEOUS | Status: DC
  Administered 2020-12-02: 1 via TRANSDERMAL
  Filled 2020-12-02: qty 1

## 2020-12-02 MED ORDER — IBUPROFEN 800 MG PO TABS
800.0000 mg | ORAL_TABLET | Freq: Once | ORAL | Status: AC
  Administered 2020-12-02: 800 mg via ORAL
  Filled 2020-12-02: qty 1

## 2020-12-02 MED ORDER — CYCLOBENZAPRINE HCL 10 MG PO TABS: 10.0000 mg | ORAL_TABLET | Freq: Two times a day (BID) | ORAL | 0 refills | Status: AC | PRN

## 2020-12-02 MED ORDER — CYCLOBENZAPRINE HCL 10 MG PO TABS
5.0000 mg | ORAL_TABLET | Freq: Once | ORAL | Status: AC
  Administered 2020-12-02: 5 mg via ORAL
  Filled 2020-12-02: qty 1

## 2020-12-02 MED ORDER — ACETAMINOPHEN 325 MG PO TABS
650.0000 mg | ORAL_TABLET | Freq: Once | ORAL | Status: AC
  Administered 2020-12-02: 650 mg via ORAL
  Filled 2020-12-02: qty 2

## 2020-12-02 MED ORDER — CYCLOBENZAPRINE HCL 10 MG PO TABS: 10.0000 mg | ORAL_TABLET | Freq: Two times a day (BID) | ORAL | 0 refills | Status: DC | PRN

## 2020-12-02 MED ORDER — IBUPROFEN 600 MG PO TABS: 600.0000 mg | ORAL_TABLET | Freq: Four times a day (QID) | ORAL | 0 refills | Status: AC | PRN
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
## 2021-09-09 IMAGING — CR DG CHEST 2V
2 series · 2 of 2 positions shown · non-contrast
Comparison: None.

CLINICAL DATA: Chest pain.

EXAM:
CHEST - 2 VIEW

[chest pa]
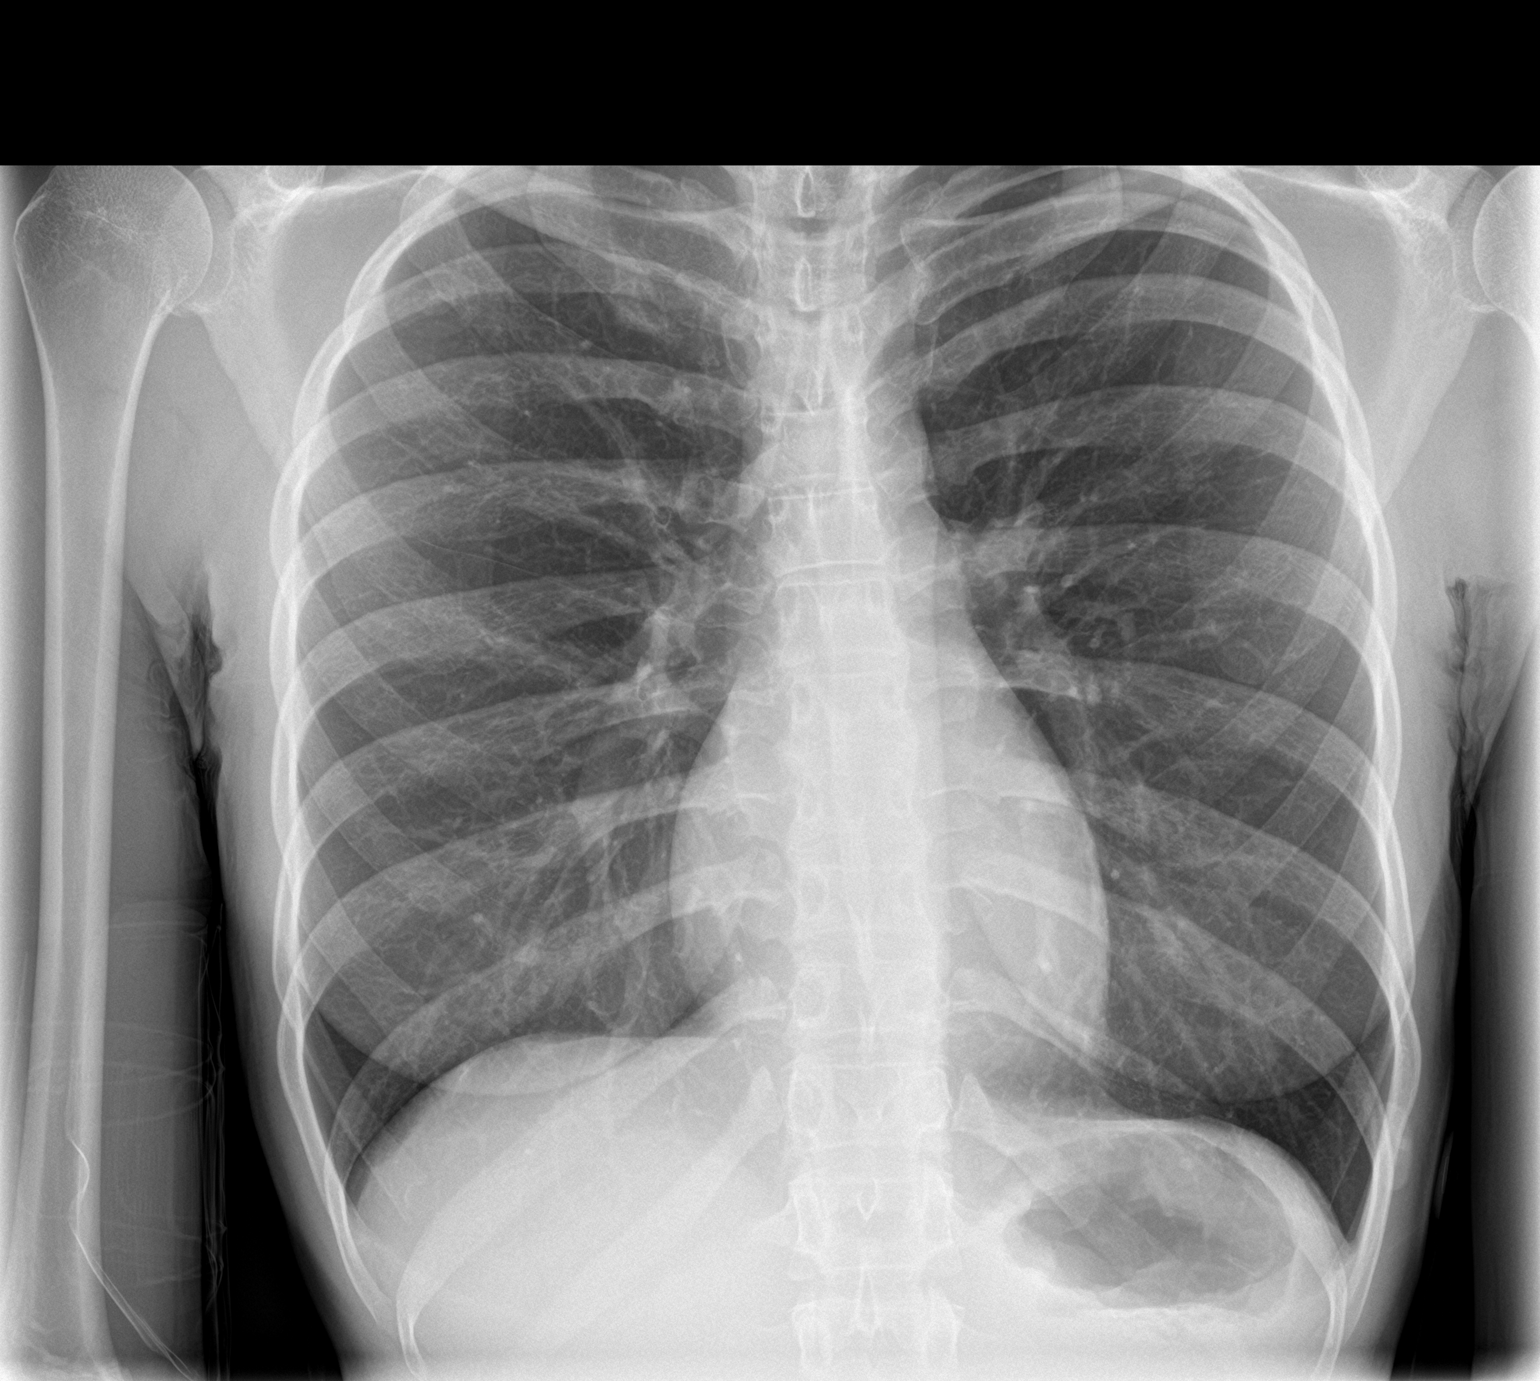

[chest lat]
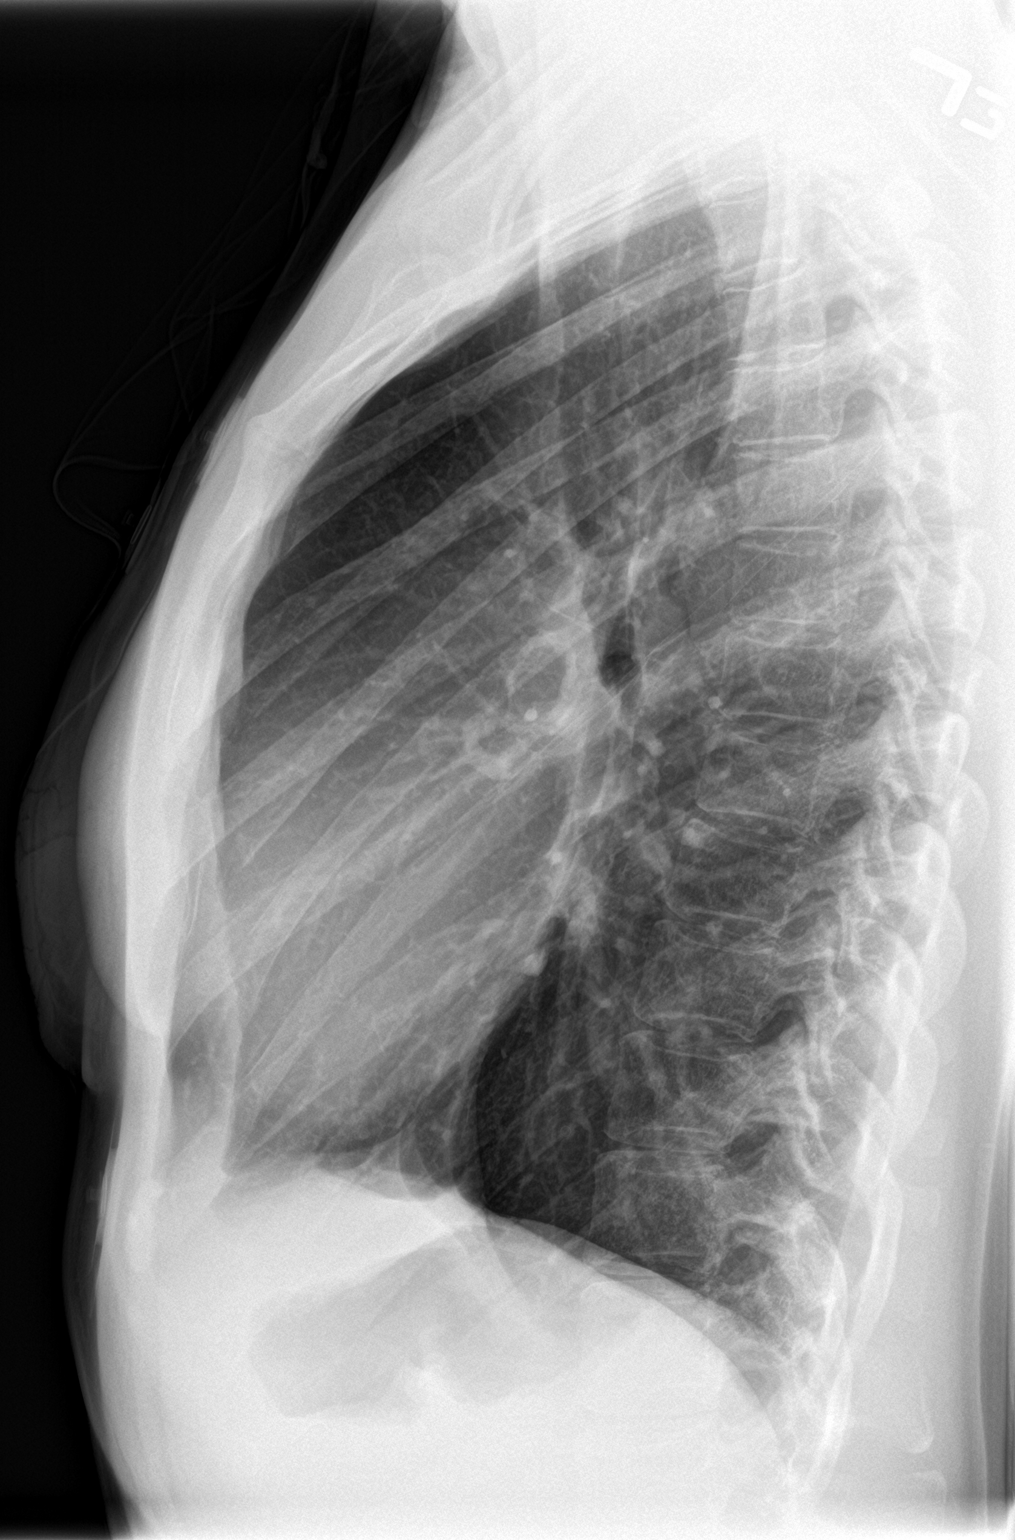

[2 of 2 positions shown; findings below may reference images not displayed]

FINDINGS: The heart size and mediastinal contours are within normal limits.
Both lungs are clear. No pneumothorax or pleural effusion is noted.
The visualized skeletal structures are unremarkable.
IMPRESSION: No active cardiopulmonary disease.

## 2021-10-06 ENCOUNTER — Ambulatory Visit (HOSPITAL_COMMUNITY)
Admission: EM | Admit: 2021-10-06 | Discharge: 2021-10-06 | Disposition: A | Discharge status: Home / Self Care | Payer: Medicaid Other | Attending: Physician Assistant | Admitting: Physician Assistant

## 2021-10-06 ENCOUNTER — Encounter (HOSPITAL_COMMUNITY): Payer: Self-pay

## 2021-10-06 ENCOUNTER — Other Ambulatory Visit: Payer: Self-pay

## 2021-10-06 DIAGNOSIS — J101 Influenza due to other identified influenza virus with other respiratory manifestations: Secondary | ICD-10-CM | POA: Diagnosis not present

## 2021-10-06 LAB — POC INFLUENZA A AND B ANTIGEN (URGENT CARE ONLY)
INFLUENZA A ANTIGEN, POC: POSITIVE — AB
INFLUENZA B ANTIGEN, POC: NEGATIVE

## 2021-10-06 MED ORDER — OSELTAMIVIR PHOSPHATE 75 MG PO CAPS: 75.0000 mg | ORAL_CAPSULE | Freq: Two times a day (BID) | ORAL | 0 refills | Status: DC

## 2021-10-06 MED ORDER — PROMETHAZINE-DM 6.25-15 MG/5ML PO SYRP: 5.0000 mL | ORAL_SOLUTION | Freq: Three times a day (TID) | ORAL | 0 refills | Status: DC | PRN

## 2021-10-06 NOTE — ED Provider Notes (Signed)
[facility]    CSN: [id number] Arrival date & time: 10/06/21  1538      History   Chief Complaint Chief Complaint  Patient presents with   Sore Throat   Headache    HPI Sharon Day is a 25 y.o. female.   Patient presents today with a 36-hour history of URI symptoms.  Reports sore throat, subjective fever, chills, headache, fatigue, malaise, nasal congestion, cough.  Denies chest pain, shortness of breath, body aches, nausea, vomiting, diarrhea.  She has tried Mucinex without improvement of symptoms.  Denies any known sick contacts but does work around many people recently been out due to illness.  She has not had influenza or COVID-19 vaccine.  Has not had COVID in the past.  Denies any recent antibiotic use.  Does not smoke.   History reviewed. No pertinent past medical history.  There are no problems to display for this patient.   History reviewed. No pertinent surgical history.  OB History   No obstetric history on file.      Home Medications    Prior to Admission medications   Medication Sig Start Date End Date Taking? Authorizing Provider  oseltamivir (TAMIFLU) 75 MG capsule Take 1 capsule (75 mg total) by mouth every 12 (twelve) hours. 10/06/21  Yes Demontray Franta K, PA-C  promethazine-dextromethorphan (PROMETHAZINE-DM) 6.25-15 MG/5ML syrup Take 5 mLs by mouth 3 (three) times daily as needed for cough. 10/06/21  Yes Jessah Danser, Noberto Retort, PA-C  acetaminophen (TYLENOL) 500 MG tablet Take 500 mg by mouth every 6 (six) hours as needed for mild pain.     [provider]    Family History History reviewed. No pertinent family history.  Social History Social History   Tobacco Use   Smoking status: Never   Smokeless tobacco: Never  Substance Use Topics   Alcohol use: Yes   Drug use: Never     Allergies   Patient has no known allergies.   Review of Systems Review of Systems  Constitutional:  Positive for activity change, chills, fatigue and  fever. Negative for appetite change.  HENT:  Positive for congestion and sore throat. Negative for sinus pressure and sneezing.   Respiratory:  Positive for cough. Negative for shortness of breath.   Cardiovascular:  Negative for chest pain.  Gastrointestinal:  Positive for abdominal pain. Negative for diarrhea, nausea and vomiting.  Musculoskeletal:  Negative for arthralgias and myalgias.  Neurological:  Positive for light-headedness and headaches. Negative for dizziness.    Physical Exam Triage Vital Signs ED Triage Vitals  Enc Vitals Group     BP 10/06/21 1750 118/85     Pulse Rate 10/06/21 1750 76     Resp 10/06/21 1750 17     Temp 10/06/21 1750 97.8 F (36.6 C)     Temp Source 10/06/21 1750 Oral     SpO2 10/06/21 1750 100 %     Weight --      Height --      Head Circumference --      Peak Flow --      Pain Score 10/06/21 1751 8     Pain Loc --      Pain Edu? --      Excl. in GC? --    No data found.  Updated Vital Signs BP 118/85 (BP Location: Left Arm)   Pulse 76   Temp 97.8 F (36.6 C) (Oral)   Resp 17   LMP 09/13/2021 (Exact Date)   SpO2  100%   Visual Acuity Right Eye Distance:   Left Eye Distance:   Bilateral Distance:    Right Eye Near:   Left Eye Near:    Bilateral Near:     Physical Exam Vitals reviewed.  Constitutional:      General: She is awake. She is not in acute distress.    Appearance: Normal appearance. She is well-developed. She is not ill-appearing.     Comments: Very pleasant female appears stated age in no acute distress sitting comfortably in exam room  HENT:     Head: Normocephalic and atraumatic.     Right Ear: Tympanic membrane, ear canal and external ear normal. Tympanic membrane is not erythematous or bulging.     Left Ear: Tympanic membrane, ear canal and external ear normal. Tympanic membrane is not erythematous or bulging.     Nose:     Right Sinus: No maxillary sinus tenderness or frontal sinus tenderness.     Left Sinus:  No maxillary sinus tenderness or frontal sinus tenderness.     Mouth/Throat:     Pharynx: Uvula midline. Posterior oropharyngeal erythema present. No oropharyngeal exudate.  Cardiovascular:     Rate and Rhythm: Normal rate and regular rhythm.     Heart sounds: Normal heart sounds, S1 normal and S2 normal. No murmur heard. Pulmonary:     Effort: Pulmonary effort is normal.     Breath sounds: Normal breath sounds. No wheezing, rhonchi or rales.     Comments: Clear to auscultation bilaterally Psychiatric:        Behavior: Behavior is cooperative.     UC Treatments / Results  Labs (all labs ordered are listed, but only abnormal results are displayed) Labs Reviewed  POC INFLUENZA A AND B ANTIGEN (URGENT CARE ONLY) - Abnormal; Notable for the following components:      Result Value   INFLUENZA A ANTIGEN, POC POSITIVE (*)    All other components within normal limits    EKG   Radiology No results found.  Procedures Procedures (including critical care time)  Medications Ordered in UC Medications - No data to display  Initial Impression / Assessment and Plan / UC Course  I have reviewed the triage vital signs and the nursing notes.  Pertinent labs & imaging results that were available during my care of the patient were reviewed by me and considered in my medical decision making (see chart for details).     Patient has a positive for influenza A.  She was started on Tamiflu she is within 72 hours of symptom onset.  Recommended she use over-the-counter medications for additional symptom relief.  She was prescribed Promethazine DM for cough with instruction not to drive drink alcohol with this medication as drowsiness is a common side effect.  She is to rest and drink plenty fluid.  She was provided work excuse note.  Discussed alarm symptoms that warrant emergent evaluation.  Strict return precautions given to which she expressed understanding.  Final Clinical Impressions(s) / UC  Diagnoses   Final diagnoses:  Influenza A     Discharge Instructions      You tested positive for the flu.  Please take Tamiflu twice daily.  Use Promethazine DM for cough.  This can make you sleepy do not drive or drink alcohol taking it.  Alternate Tylenol ibuprofen.  Make sure you drink plenty of fluids.  If anything worsens return for reevaluation.     ED Prescriptions     Medication Sig Dispense  Auth. Provider   oseltamivir (TAMIFLU) 75 MG capsule Take 1 capsule (75 mg total) by mouth every 12 (twelve) hours. 10 capsule Tameca Jerez K, PA-C   promethazine-dextromethorphan (PROMETHAZINE-DM) 6.25-15 MG/5ML syrup Take 5 mLs by mouth 3 (three) times daily as needed for cough. 118 mL Jamise Pentland K, PA-C      PDMP not reviewed this encounter.   Jeani Hawking, PA-C 10/06/21 1918

## 2021-10-06 NOTE — Discharge Instructions (Signed)
You tested positive for the flu.  Please take Tamiflu twice daily.  Use Promethazine DM for cough.  This can make you sleepy do not drive or drink alcohol taking it.  Alternate Tylenol ibuprofen.  Make sure you drink plenty of fluids.  If anything worsens return for reevaluation.

## 2021-10-06 NOTE — ED Triage Notes (Signed)
Pt reports sore throat, chills, headache and lightheadedness x 1 day. Reports she had blurred vision yesterday at work.

## 2021-11-04 ENCOUNTER — Ambulatory Visit (HOSPITAL_COMMUNITY)
Admission: EM | Admit: 2021-11-04 | Discharge: 2021-11-04 | Disposition: A | Discharge status: Home / Self Care | Payer: Medicaid Other | Attending: Internal Medicine | Admitting: Internal Medicine

## 2021-11-04 ENCOUNTER — Encounter (HOSPITAL_COMMUNITY): Payer: Self-pay

## 2021-11-04 ENCOUNTER — Other Ambulatory Visit: Payer: Self-pay

## 2021-11-04 DIAGNOSIS — K2900 Acute gastritis without bleeding: Secondary | ICD-10-CM | POA: Diagnosis not present

## 2021-11-04 DIAGNOSIS — Z113 Encounter for screening for infections with a predominantly sexual mode of transmission: Secondary | ICD-10-CM | POA: Diagnosis not present

## 2021-11-04 LAB — POCT URINALYSIS DIPSTICK, ED / UC
Bilirubin Urine: NEGATIVE
Glucose, UA: NEGATIVE mg/dL
Hgb urine dipstick: NEGATIVE
Leukocytes,Ua: NEGATIVE
Nitrite: NEGATIVE
Protein, ur: NEGATIVE mg/dL
Specific Gravity, Urine: 1.025 (ref 1.005–1.030)
Urobilinogen, UA: 1 mg/dL (ref 0.0–1.0)
pH: 7 (ref 5.0–8.0)

## 2021-11-04 LAB — POC URINE PREG, ED: Preg Test, Ur: NEGATIVE

## 2021-11-04 LAB — COMPREHENSIVE METABOLIC PANEL
ALT: 17 U/L (ref 0–44)
AST: 20 U/L (ref 15–41)
Albumin: 3.9 g/dL (ref 3.5–5.0)
Alkaline Phosphatase: 37 U/L — ABNORMAL LOW (ref 38–126)
Anion gap: 5 (ref 5–15)
BUN: 8 mg/dL (ref 6–20)
CO2: 26 mmol/L (ref 22–32)
Calcium: 9 mg/dL (ref 8.9–10.3)
Chloride: 107 mmol/L (ref 98–111)
Creatinine, Ser: 0.79 mg/dL (ref 0.44–1.00)
GFR, Estimated: >60 mL/min (ref >60)
Glucose, Bld: 86 mg/dL (ref 70–99)
Potassium: 3.7 mmol/L (ref 3.5–5.1)
Sodium: 138 mmol/L (ref 135–145)
Total Bilirubin: 0.8 mg/dL (ref 0.3–1.2)
Total Protein: 6.5 g/dL (ref 6.5–8.1)

## 2021-11-04 LAB — LIPASE, BLOOD: Lipase: 35 U/L (ref 11–51)

## 2021-11-04 MED ORDER — LIDOCAINE VISCOUS HCL 2 % MT SOLN
OROMUCOSAL | Status: AC
  Filled 2021-11-04: qty 15

## 2021-11-04 MED ORDER — ALUM & MAG HYDROXIDE-SIMETH 200-200-20 MG/5ML PO SUSP
ORAL | Status: AC
  Filled 2021-11-04: qty 30

## 2021-11-04 MED ORDER — ONDANSETRON 4 MG PO TBDP
4.0000 mg | ORAL_TABLET | Freq: Once | ORAL | Status: AC
  Administered 2021-11-04: 4 mg via ORAL

## 2021-11-04 MED ORDER — PANTOPRAZOLE SODIUM 20 MG PO TBEC: 20.0000 mg | DELAYED_RELEASE_TABLET | Freq: Every day | ORAL | 1 refills | Status: DC

## 2021-11-04 MED ORDER — ONDANSETRON 4 MG PO TBDP
ORAL_TABLET | ORAL | Status: AC
  Filled 2021-11-04: qty 1

## 2021-11-04 MED ORDER — LIDOCAINE VISCOUS HCL 2 % MT SOLN
15.0000 mL | Freq: Once | OROMUCOSAL | Status: AC
  Administered 2021-11-04: 15 mL via ORAL

## 2021-11-04 MED ORDER — ONDANSETRON 4 MG PO TBDP: 4.0000 mg | ORAL_TABLET | Freq: Three times a day (TID) | ORAL | 0 refills | Status: DC | PRN

## 2021-11-04 MED ORDER — ALUM & MAG HYDROXIDE-SIMETH 200-200-20 MG/5ML PO SUSP
30.0000 mL | Freq: Once | ORAL | Status: AC
  Administered 2021-11-04: 30 mL via ORAL

## 2021-11-04 NOTE — ED Provider Notes (Signed)
[facility]    CSN: [id number] Arrival date & time: 11/04/21  1421      History   Chief Complaint Chief Complaint  Patient presents with   Back Pain   Abdominal Pain    HPI Sharon Day is a 26 y.o. female comes to urgent care with epigastric abdominal pain radiating to the back of 2 days duration.  Patient's symptoms started about a couple of days ago.  Pain is of moderate severity. Epigastric pain is throbbing in nature and radiates to the back.  Patient has nausea and has had an episode of nonbloody nonbilious emesis.  Patient drank some alcohol on New Year's Day but she endorses epigastric abdominal pain prior to drinking alcohol.  Pain radiated to her back after alcohol intake.  No change in bowel movements.  No abdominal bloating.  No fever or chills.  No easy bruising or bleeding.Marland Kitchen   HPI  History reviewed. No pertinent past medical history.  There are no problems to display for this patient.   History reviewed. No pertinent surgical history.  OB History   No obstetric history on file.      Home Medications    Prior to Admission medications   Medication Sig Start Date End Date Taking? Authorizing Provider  ondansetron (ZOFRAN-ODT) 4 MG disintegrating tablet Take 1 tablet (4 mg total) by mouth every 8 (eight) hours as needed for nausea or vomiting. 11/04/21  Yes Daiwik Buffalo, Britta Mccreedy, MD  pantoprazole (PROTONIX) 20 MG tablet Take 1 tablet (20 mg total) by mouth daily. 11/04/21  Yes Dade Rodin, Britta Mccreedy, MD  acetaminophen (TYLENOL) 500 MG tablet Take 500 mg by mouth every 6 (six) hours as needed for mild pain.     [provider]  promethazine-dextromethorphan (PROMETHAZINE-DM) 6.25-15 MG/5ML syrup Take 5 mLs by mouth 3 (three) times daily as needed for cough. 10/06/21   Raspet, Noberto Retort, PA-C    Family History History reviewed. No pertinent family history.  Social History Social History   Tobacco Use   Smoking status: Never   Smokeless tobacco: Never   Substance Use Topics   Alcohol use: Yes   Drug use: Never     Allergies   Patient has no known allergies.   Review of Systems Review of Systems  HENT: Negative.    Respiratory: Negative.    Gastrointestinal:  Positive for abdominal pain, nausea and vomiting. Negative for blood in stool and diarrhea.  Neurological: Negative.  Negative for dizziness and light-headedness.    Physical Exam Triage Vital Signs ED Triage Vitals  Enc Vitals Group     BP 11/04/21 1756 123/89     Pulse Rate 11/04/21 1756 62     Resp 11/04/21 1756 16     Temp 11/04/21 1756 98.1 F (36.7 C)     Temp Source 11/04/21 1756 Oral     SpO2 11/04/21 1756 100 %     Weight --      Height --      Head Circumference --      Peak Flow --      Pain Score 11/04/21 1757 10     Pain Loc --      Pain Edu? --      Excl. in GC? --    No data found.  Updated Vital Signs BP 123/89 (BP Location: Left Arm)    Pulse 62    Temp 98.1 F (36.7 C) (Oral)    Resp 16    SpO2 100%  Visual Acuity Right Eye Distance:   Left Eye Distance:   Bilateral Distance:    Right Eye Near:   Left Eye Near:    Bilateral Near:     Physical Exam Vitals and nursing note reviewed.  Constitutional:      General: She is not in acute distress.    Appearance: She is not ill-appearing.  Pulmonary:     Effort: Pulmonary effort is normal.     Breath sounds: Normal breath sounds.  Abdominal:     General: Bowel sounds are normal.     Palpations: Abdomen is soft. There is no shifting dullness, hepatomegaly or splenomegaly.     Tenderness: There is abdominal tenderness in the epigastric area. There is no guarding or rebound.     Hernia: No hernia is present.  Neurological:     Mental Status: She is alert.     UC Treatments / Results  Labs (all labs ordered are listed, but only abnormal results are displayed) Labs Reviewed  POCT URINALYSIS DIPSTICK, ED / UC - Abnormal; Notable for the following components:      Result Value    Ketones, ur TRACE (*)    All other components within normal limits  LIPASE, BLOOD  COMPREHENSIVE METABOLIC PANEL  POC URINE PREG, ED  CERVICOVAGINAL ANCILLARY ONLY    EKG   Radiology No results found.  Procedures Procedures (including critical care time)  Medications Ordered in UC Medications  ondansetron (ZOFRAN-ODT) disintegrating tablet 4 mg (has no administration in time range)  alum & mag hydroxide-simeth (MAALOX/MYLANTA) 200-200-20 MG/5ML suspension 30 mL (30 mLs Oral Given 11/04/21 1907)    And  lidocaine (XYLOCAINE) 2 % viscous mouth solution 15 mL (15 mLs Oral Given 11/04/21 1907)    Initial Impression / Assessment and Plan / UC Course  I have reviewed the triage vital signs and the nursing notes.  Pertinent labs & imaging results that were available during my care of the patient were reviewed by me and considered in my medical decision making (see chart for details).     1.  Acute gastritis without hemorrhage: GI cocktail-patient endorsed some improvement in abdominal pain Protonix Zofran as needed for nausea maintain adequate hydration If you have worsening abdominal pain please go to the emergency room for further evaluation CMP, lipase.    2.  STD screen: Urine pregnancy test is negative Cervical vaginal swab for GC/chlamydia/trichomonas Return precautions given Final Clinical Impressions(s) / UC Diagnoses   Final diagnoses:  Acute superficial gastritis without hemorrhage  Screen for STD (sexually transmitted disease)     Discharge Instructions      Maintain adequate hydration Take medications as recommended If symptoms worsen please go to the emergency department We will call you with recommendations if labs are abnormal Your urine pregnancy test is negative.   ED Prescriptions     Medication Sig Dispense Auth. Provider   pantoprazole (PROTONIX) 20 MG tablet Take 1 tablet (20 mg total) by mouth daily. 30 tablet Morgana Rowley, Britta Mccreedy, MD    ondansetron (ZOFRAN-ODT) 4 MG disintegrating tablet Take 1 tablet (4 mg total) by mouth every 8 (eight) hours as needed for nausea or vomiting. 20 tablet Jerolene Kupfer, Britta Mccreedy, MD      PDMP not reviewed this encounter.   Merrilee Jansky, MD 11/04/21 [phone]

## 2021-11-04 NOTE — Discharge Instructions (Addendum)
Maintain adequate hydration Take medications as recommended If symptoms worsen please go to the emergency department We will call you with recommendations if labs are abnormal Your urine pregnancy test is negative.

## 2021-11-04 NOTE — ED Triage Notes (Signed)
Pt report abdominal and back pain x 2 days. She is requesting HCG to make sure she is not pregnant. Tubal ligation  2020

## 2021-11-05 LAB — CERVICOVAGINAL ANCILLARY ONLY
Chlamydia: NEGATIVE
Comment: NEGATIVE
Comment: NEGATIVE
Comment: NORMAL
Neisseria Gonorrhea: NEGATIVE
Trichomonas: NEGATIVE

## 2021-11-07 ENCOUNTER — Emergency Department (HOSPITAL_COMMUNITY)
Admission: EM | Admit: 2021-11-07 | Discharge: 2021-11-08 | Disposition: A | Discharge status: Home / Self Care | Payer: Medicaid Other | Attending: Emergency Medicine | Admitting: Emergency Medicine

## 2021-11-07 ENCOUNTER — Encounter (HOSPITAL_COMMUNITY): Payer: Self-pay | Admitting: Emergency Medicine

## 2021-11-07 DIAGNOSIS — Z5321 Procedure and treatment not carried out due to patient leaving prior to being seen by health care provider: Secondary | ICD-10-CM | POA: Insufficient documentation

## 2021-11-07 DIAGNOSIS — K5900 Constipation, unspecified: Secondary | ICD-10-CM | POA: Insufficient documentation

## 2021-11-07 DIAGNOSIS — R197 Diarrhea, unspecified: Secondary | ICD-10-CM | POA: Diagnosis not present

## 2021-11-07 DIAGNOSIS — R42 Dizziness and giddiness: Secondary | ICD-10-CM | POA: Insufficient documentation

## 2021-11-07 DIAGNOSIS — R1013 Epigastric pain: Secondary | ICD-10-CM | POA: Diagnosis not present

## 2021-11-07 LAB — COMPREHENSIVE METABOLIC PANEL
ALT: 28 U/L (ref 0–44)
AST: 35 U/L (ref 15–41)
Albumin: 4.2 g/dL (ref 3.5–5.0)
Alkaline Phosphatase: 40 U/L (ref 38–126)
Anion gap: 8 (ref 5–15)
BUN: 7 mg/dL (ref 6–20)
CO2: 26 mmol/L (ref 22–32)
Calcium: 9.1 mg/dL (ref 8.9–10.3)
Chloride: 105 mmol/L (ref 98–111)
Creatinine, Ser: 0.91 mg/dL (ref 0.44–1.00)
GFR, Estimated: >60 mL/min (ref >60)
Glucose, Bld: 93 mg/dL (ref 70–99)
Potassium: 4.3 mmol/L (ref 3.5–5.1)
Sodium: 139 mmol/L (ref 135–145)
Total Bilirubin: 0.6 mg/dL (ref 0.3–1.2)
Total Protein: 7 g/dL (ref 6.5–8.1)

## 2021-11-07 LAB — URINALYSIS, ROUTINE W REFLEX MICROSCOPIC
Bilirubin Urine: NEGATIVE
Glucose, UA: NEGATIVE mg/dL
Hgb urine dipstick: NEGATIVE
Ketones, ur: NEGATIVE mg/dL
Leukocytes,Ua: NEGATIVE
Nitrite: NEGATIVE
Protein, ur: NEGATIVE mg/dL
Specific Gravity, Urine: 1.028 (ref 1.005–1.030)
pH: 7 (ref 5.0–8.0)

## 2021-11-07 LAB — LIPASE, BLOOD: Lipase: 39 U/L (ref 11–51)

## 2021-11-07 LAB — CBC
HCT: 42.1 % (ref 36.0–46.0)
Hemoglobin: 13.6 g/dL (ref 12.0–15.0)
MCH: 30 pg (ref 26.0–34.0)
MCHC: 32.3 g/dL (ref 30.0–36.0)
MCV: 92.9 fL (ref 80.0–100.0)
Platelets: 199 10*3/uL (ref 150–400)
RBC: 4.53 MIL/uL (ref 3.87–5.11)
RDW: 12.8 % (ref 11.5–15.5)
WBC: 5.3 10*3/uL (ref 4.0–10.5)
nRBC: 0 % (ref 0.0–0.2)

## 2021-11-07 LAB — I-STAT BETA HCG BLOOD, ED (MC, WL, AP ONLY): I-stat hCG, quantitative: <5 m[IU]/mL (ref <5)

## 2021-11-07 NOTE — ED Triage Notes (Signed)
Went to UC and diagnosed with gastroenteritis. Told to come back if got worse and it has. Pain 8/10. Tylenol at 2pm. Pt has diarrhea. Denies pregnancy. Pt has had some lightheadedness. No fever. Pain located has epigastric, No SOB. No dysuria. Pt says she feels constipated and has pain with BM.

## 2021-11-08 NOTE — ED Notes (Signed)
Pt called 4x no answer  

## 2022-01-09 ENCOUNTER — Other Ambulatory Visit: Payer: Self-pay

## 2022-01-09 ENCOUNTER — Ambulatory Visit (HOSPITAL_COMMUNITY)
Admission: EM | Admit: 2022-01-09 | Discharge: 2022-01-09 | Disposition: A | Discharge status: Home / Self Care | Payer: Medicaid Other | Attending: Family Medicine | Admitting: Family Medicine

## 2022-01-09 ENCOUNTER — Encounter (HOSPITAL_COMMUNITY): Payer: Self-pay

## 2022-01-09 DIAGNOSIS — J069 Acute upper respiratory infection, unspecified: Secondary | ICD-10-CM

## 2022-01-09 DIAGNOSIS — Z113 Encounter for screening for infections with a predominantly sexual mode of transmission: Secondary | ICD-10-CM | POA: Diagnosis not present

## 2022-01-09 DIAGNOSIS — J029 Acute pharyngitis, unspecified: Secondary | ICD-10-CM | POA: Insufficient documentation

## 2022-01-09 LAB — POCT RAPID STREP A, ED / UC: Streptococcus, Group A Screen (Direct): NEGATIVE

## 2022-01-09 LAB — HIV ANTIBODY (ROUTINE TESTING W REFLEX): HIV Screen 4th Generation wRfx: NONREACTIVE

## 2022-01-09 LAB — RPR: RPR Ser Ql: NONREACTIVE

## 2022-01-09 NOTE — Discharge Instructions (Signed)
You were seen today for sore throat.  ?Your rapid strep was negative.  This will be sent for culture and if positive will be notified to start treatment.  ?You sore throat is likely viral in nature, caused by sinus congestion and drainage.  I recommend tylenol and salt water gargles.  You may use over the counter sudafed or zyrtec to help with sinus congestion.  ?Your vaginal swab and lab work will be resulted tomorrow.  If there is anything to treat we will call you to notify you.  ?

## 2022-01-09 NOTE — ED Provider Notes (Signed)
?[facility] ? ? ? ?CSN: [id number] ?Arrival date & time: 01/09/22  [id number] ? ? ?  ? ?History   ?Chief Complaint ?Chief Complaint  ?Patient presents with  ? Sore Throat  ? ? ?HPI ?Sharon Day is a 26 y.o. female.  ? ?Patient is here for sore throat since yesterday.  Also with runny nose,congestion, cough.  Feeling warm, no fever.  No headache or abd pain.  ?She would like like std screening today.  No symptoms, no known exposures.  ? ?History reviewed. No pertinent past medical history. ? ?There are no problems to display for this patient. ? ? ?History reviewed. No pertinent surgical history. ? ?OB History   ?No obstetric history on file. ?  ? ? ? ?Home Medications   ? ?Prior to Admission medications   ?Medication Sig Start Date End Date Taking? Authorizing Provider  ?acetaminophen (TYLENOL) 500 MG tablet Take 500 mg by mouth every 6 (six) hours as needed for mild pain.     [provider]  ?ondansetron (ZOFRAN-ODT) 4 MG disintegrating tablet Take 1 tablet (4 mg total) by mouth every 8 (eight) hours as needed for nausea or vomiting. 11/04/21   LampteyMyrene Galas, MD  ?pantoprazole (PROTONIX) 20 MG tablet Take 1 tablet (20 mg total) by mouth daily. 11/04/21   Chase Picket, MD  ?promethazine-dextromethorphan (PROMETHAZINE-DM) 6.25-15 MG/5ML syrup Take 5 mLs by mouth 3 (three) times daily as needed for cough. 10/06/21   Raspet, Derry Skill, PA-C  ? ? ?Family History ?Family History  ?Family history unknown: Yes  ? ? ?Social History ?Social History  ? ?Tobacco Use  ? Smoking status: Never  ? Smokeless tobacco: Never  ?Substance Use Topics  ? Alcohol use: Yes  ? Drug use: Never  ? ? ? ?Allergies   ?Patient has no known allergies. ? ? ?Review of Systems ?Review of Systems  ?Constitutional:  Negative for chills and fever.  ?HENT:  Positive for congestion, rhinorrhea and sore throat.   ?Respiratory:  Negative for cough.   ?Cardiovascular: Negative.   ?Gastrointestinal: Negative.   ? ? ?Physical Exam ?Triage Vital  Signs ?ED Triage Vitals  ?Enc Vitals Group  ?   BP 01/09/22 0955 110/76  ?   Pulse Rate 01/09/22 0955 74  ?   Resp 01/09/22 0955 18  ?   Temp 01/09/22 0955 98.2 ?F (36.8 ?C)  ?   Temp Source 01/09/22 0955 Oral  ?   SpO2 01/09/22 0955 98 %  ?   Weight --   ?   Height --   ?   Head Circumference --   ?   Peak Flow --   ?   Pain Score 01/09/22 0959 5  ?   Pain Loc --   ?   Pain Edu? --   ?   Excl. in [place]? --   ? ?No data found. ? ?Updated Vital Signs ?BP 110/76 (BP Location: Left Arm)   Pulse 74   Temp 98.2 ?F (36.8 ?C) (Oral)   Resp 18   LMP 01/07/2022   SpO2 98%  ? ?Visual Acuity ?Right Eye Distance:   ?Left Eye Distance:   ?Bilateral Distance:   ? ?Right Eye Near:   ?Left Eye Near:    ?Bilateral Near:    ? ?Physical Exam ?Constitutional:   ?   Appearance: She is well-developed.  ?HENT:  ?   Head: Normocephalic and atraumatic.  ?   Nose: Congestion present.  ?   Right  Sinus: Maxillary sinus tenderness and frontal sinus tenderness present.  ?   Left Sinus: Maxillary sinus tenderness and frontal sinus tenderness present.  ?   Mouth/Throat:  ?   Mouth: Mucous membranes are moist.  ?   Pharynx: No oropharyngeal exudate or posterior oropharyngeal erythema.  ?Cardiovascular:  ?   Rate and Rhythm: Normal rate and regular rhythm.  ?Pulmonary:  ?   Effort: Pulmonary effort is normal.  ?   Breath sounds: Normal breath sounds.  ?Musculoskeletal:  ?   Cervical back: Normal range of motion and neck supple.  ?Neurological:  ?   Mental Status: She is alert.  ? ? ? ?UC Treatments / Results  ?Labs ?(all labs ordered are listed, but only abnormal results are displayed) ?Labs Reviewed - No data to display ? ?EKG ? ? ?Radiology ?No results found. ? ?Procedures ?Procedures (including critical care time) ? ?Medications Ordered in UC ?Medications - No data to display ? ?Initial Impression / Assessment and Plan / UC Course  ?I have reviewed the triage vital signs and the nursing notes. ? ?Pertinent labs & imaging results that were  available during my care of the patient were reviewed by me and considered in my medical decision making (see chart for details). ? ?  ?Final Clinical Impressions(s) / UC Diagnoses  ? ?Final diagnoses:  ?Sore throat  ?Screening examination for STD (sexually transmitted disease)  ?Upper respiratory tract infection, unspecified type  ? ? ? ?Discharge Instructions   ? ?  ?You were seen today for sore throat.  ?Your rapid strep was negative.  This will be sent for culture and if positive will be notified to start treatment.  ?You sore throat is likely viral in nature, caused by sinus congestion and drainage.  I recommend tylenol and salt water gargles.  You may use over the counter sudafed or zyrtec to help with sinus congestion.  ?Your vaginal swab and lab work will be resulted tomorrow.  If there is anything to treat we will call you to notify you.  ? ? ? ?ED Prescriptions   ?None ?  ? ?PDMP not reviewed this encounter. ?  Rondel Oh, MD ?01/09/22 1040 ? ?

## 2022-01-09 NOTE — ED Triage Notes (Signed)
Pt presents with sore throat since waking up this morning. 

## 2022-01-11 LAB — CULTURE, GROUP A STREP (THRC)

## 2022-01-14 ENCOUNTER — Telehealth: Payer: Self-pay | Admitting: Emergency Medicine

## 2022-01-14 LAB — CERVICOVAGINAL ANCILLARY ONLY
Bacterial Vaginitis (gardnerella): POSITIVE — AB
Candida Glabrata: NEGATIVE
Candida Vaginitis: POSITIVE — AB
Chlamydia: NEGATIVE
Comment: NEGATIVE
Comment: NEGATIVE
Comment: NEGATIVE
Comment: NEGATIVE
Comment: NEGATIVE
Comment: NORMAL
Neisseria Gonorrhea: NEGATIVE
Trichomonas: NEGATIVE

## 2022-01-14 MED ORDER — FLUCONAZOLE 150 MG PO TABS: 150.0000 mg | ORAL_TABLET | Freq: Once | ORAL | 0 refills | Status: AC

## 2022-01-14 MED ORDER — METRONIDAZOLE 0.75 % VA GEL: 1.0000 | Freq: Every day | VAGINAL | 0 refills | Status: AC

## 2022-01-30 ENCOUNTER — Ambulatory Visit (HOSPITAL_COMMUNITY)
Admission: EM | Admit: 2022-01-30 | Discharge: 2022-01-30 | Disposition: A | Discharge status: Home / Self Care | Payer: Medicaid Other | Attending: Family Medicine | Admitting: Family Medicine

## 2022-01-30 ENCOUNTER — Encounter (HOSPITAL_COMMUNITY): Payer: Self-pay | Admitting: *Deleted

## 2022-01-30 ENCOUNTER — Other Ambulatory Visit: Payer: Self-pay

## 2022-01-30 DIAGNOSIS — R21 Rash and other nonspecific skin eruption: Secondary | ICD-10-CM

## 2022-01-30 MED ORDER — PREDNISONE 20 MG PO TABS: 40.0000 mg | ORAL_TABLET | Freq: Every day | ORAL | 0 refills | Status: AC

## 2022-01-30 NOTE — ED Triage Notes (Signed)
Reports rash started yesterday on her upper body. ?

## 2022-01-30 NOTE — Discharge Instructions (Addendum)
You were seen today for a rash.  This appears to be a contact dermatitis due to unknown cause.  ?I have sent out oral prednisone to take daily x  5 days.  You may continue benadryl as well if needed.  You may use over the counter cortisone cream on the rash to help with itch also.  ?If not improving then please return for further evaluation.  ?

## 2022-01-30 NOTE — ED Provider Notes (Signed)
?[facility] ? ? ? ?CSN: [id number] ?Arrival date & time: 01/30/22  0845 ? ? ?  ? ?History   ?Chief Complaint ?Chief Complaint  ?Patient presents with  ? Rash  ? ? ?HPI ?Sharon Day is a 26 y.o. female.  ? ?Patient is here for a rash since last night.  Located on her back, shoulders/stomach.  Itchy.  ?She used benadryl with some help, but still bothering her.  ?No new topicals.  ?No new medications.  ?No changes in bed/sleeping areas.  ? ?History reviewed. No pertinent past medical history. ? ?There are no problems to display for this patient. ? ? ?History reviewed. No pertinent surgical history. ? ?OB History   ?No obstetric history on file. ?  ? ? ? ?Home Medications   ? ?Prior to Admission medications   ?Medication Sig Start Date End Date Taking? Authorizing Provider  ?acetaminophen (TYLENOL) 500 MG tablet Take 500 mg by mouth every 6 (six) hours as needed for mild pain.     [provider]  ?ondansetron (ZOFRAN-ODT) 4 MG disintegrating tablet Take 1 tablet (4 mg total) by mouth every 8 (eight) hours as needed for nausea or vomiting. 11/04/21   LampteyBritta Mccreedy, MD  ?pantoprazole (PROTONIX) 20 MG tablet Take 1 tablet (20 mg total) by mouth daily. 11/04/21   Merrilee Jansky, MD  ?promethazine-dextromethorphan (PROMETHAZINE-DM) 6.25-15 MG/5ML syrup Take 5 mLs by mouth 3 (three) times daily as needed for cough. 10/06/21   Raspet, Noberto Retort, PA-C  ? ? ?Family History ?Family History  ?Family history unknown: Yes  ? ? ?Social History ?Social History  ? ?Tobacco Use  ? Smoking status: Never  ? Smokeless tobacco: Never  ?Substance Use Topics  ? Alcohol use: Yes  ? Drug use: Never  ? ? ? ?Allergies   ?Patient has no known allergies. ? ? ?Review of Systems ?Review of Systems  ?Constitutional: Negative.   ?HENT: Negative.    ?Respiratory: Negative.    ?Cardiovascular: Negative.   ?Gastrointestinal: Negative.   ?Skin:  Positive for rash.  ? ? ?Physical Exam ?Triage Vital Signs ?ED Triage Vitals  ?Enc Vitals  Group  ?   BP 01/30/22 0917 117/79  ?   Pulse Rate 01/30/22 0917 64  ?   Resp 01/30/22 0917 16  ?   Temp 01/30/22 0917 98.3 ?F (36.8 ?C)  ?   Temp src --   ?   SpO2 01/30/22 0917 99 %  ?   Weight --   ?   Height --   ?   Head Circumference --   ?   Peak Flow --   ?   Pain Score 01/30/22 0916 0  ?   Pain Loc --   ?   Pain Edu? --   ?   Excl. in GC? --   ? ?No data found. ? ?Updated Vital Signs ?BP 117/79   Pulse 64   Temp 98.3 ?F (36.8 ?C)   Resp 16   LMP 01/07/2022   SpO2 99%  ? ?Visual Acuity ?Right Eye Distance:   ?Left Eye Distance:   ?Bilateral Distance:   ? ?Right Eye Near:   ?Left Eye Near:    ?Bilateral Near:    ? ?Physical Exam ?Constitutional:   ?   Appearance: Normal appearance.  ?Cardiovascular:  ?   Rate and Rhythm: Normal rate and regular rhythm.  ?Pulmonary:  ?   Effort: Pulmonary effort is normal.  ?   Breath sounds: Normal breath  sounds.  ?Skin: ?   Comments: At the right and left abdomen is a scattered rash;  slightly raised, red maculopapular rash;  similar rash at the left lateral chest;    ?Neurological:  ?   Mental Status: She is alert.  ? ? ? ?UC Treatments / Results  ?Labs ?(all labs ordered are listed, but only abnormal results are displayed) ?Labs Reviewed - No data to display ? ?EKG ? ? ?Radiology ?No results found. ? ?Procedures ?Procedures (including critical care time) ? ?Medications Ordered in UC ?Medications - No data to display ? ?Initial Impression / Assessment and Plan / UC Course  ?I have reviewed the triage vital signs and the nursing notes. ? ?Pertinent labs & imaging results that were available during my care of the patient were reviewed by me and considered in my medical decision making (see chart for details). ? ?  ?Final Clinical Impressions(s) / UC Diagnoses  ? ?Final diagnoses:  ?Rash  ? ? ? ?Discharge Instructions   ? ?  ?You were seen today for a rash.  This appears to be a contact dermatitis due to unknown cause.  ?I have sent out oral prednisone to take daily x  5  days.  You may continue benadryl as well if needed.  You may use over the counter cortisone cream on the rash to help with itch also.  ?If not improving then please return for further evaluation.  ? ? ? ?ED Prescriptions   ? ? Medication Sig Dispense Auth. Provider  ? predniSONE (DELTASONE) 20 MG tablet Take 2 tablets (40 mg total) by mouth daily for 5 days. 10 tablet Jannifer Franklin, MD  ? ?  ? ?PDMP not reviewed this encounter. ?  Jannifer Franklin, MD ?01/30/22 0935 ? ?

## 2022-03-11 ENCOUNTER — Ambulatory Visit (HOSPITAL_COMMUNITY)
Admission: EM | Admit: 2022-03-11 | Discharge: 2022-03-11 | Disposition: A | Discharge status: Home / Self Care | Payer: Medicaid Other | Attending: Family Medicine | Admitting: Family Medicine

## 2022-03-11 ENCOUNTER — Encounter (HOSPITAL_COMMUNITY): Payer: Self-pay | Admitting: Emergency Medicine

## 2022-03-11 DIAGNOSIS — R1013 Epigastric pain: Secondary | ICD-10-CM | POA: Diagnosis not present

## 2022-03-11 MED ORDER — PANTOPRAZOLE SODIUM 40 MG PO TBEC: 40.0000 mg | DELAYED_RELEASE_TABLET | Freq: Every day | ORAL | 1 refills | Status: DC

## 2022-03-11 MED ORDER — ONDANSETRON 4 MG PO TBDP: 4.0000 mg | ORAL_TABLET | Freq: Three times a day (TID) | ORAL | 0 refills | Status: DC | PRN

## 2022-03-11 MED ORDER — SUCRALFATE 1 G PO TABS: 1.0000 g | ORAL_TABLET | Freq: Two times a day (BID) | ORAL | 0 refills | Status: DC

## 2022-03-11 NOTE — ED Provider Notes (Signed)
?[facility] ? ? ? ?CSN: [id number] ?Arrival date & time: 03/11/22  0815 ? ? ?  ? ?History   ?Chief Complaint ?Chief Complaint  ?Patient presents with  ? Medication Refill  ? Letter for School/Work  ? ? ?HPI ?Sharon Day is a 26 y.o. female.  ? ? ?Medication Refill ?Here for renewed epigastric stomach pain that began 3 or 4 days ago.  She is actually not out of her pantoprazole and requests a refill of that.  She did eat some spicy chicken right before this began bothering her more; she has thrown up a couple of days ago, and requests a renewed prescription of Zofran.  Normal stool color.  No fever or chills. ? ?History reviewed. No pertinent past medical history. ? ?There are no problems to display for this patient. ? ? ?History reviewed. No pertinent surgical history. ? ?OB History   ?No obstetric history on file. ?  ? ? ? ?Home Medications   ? ?Prior to Admission medications   ?Medication Sig Start Date End Date Taking? Authorizing Provider  ?ondansetron (ZOFRAN-ODT) 4 MG disintegrating tablet Take 1 tablet (4 mg total) by mouth every 8 (eight) hours as needed for nausea or vomiting. 03/11/22  Yes Zenia Resides, MD  ?pantoprazole (PROTONIX) 40 MG tablet Take 1 tablet (40 mg total) by mouth daily. 03/11/22  Yes Zenia Resides, MD  ?sucralfate (CARAFATE) 1 g tablet Take 1 tablet (1 g total) by mouth 2 (two) times daily. 03/11/22  Yes Zenia Resides, MD  ?acetaminophen (TYLENOL) 500 MG tablet Take 500 mg by mouth every 6 (six) hours as needed for mild pain.     [provider]  ? ? ?Family History ?Family History  ?Family history unknown: Yes  ? ? ?Social History ?Social History  ? ?Tobacco Use  ? Smoking status: Never  ? Smokeless tobacco: Never  ?Substance Use Topics  ? Alcohol use: Yes  ? Drug use: Never  ? ? ? ?Allergies   ?Patient has no known allergies. ? ? ?Review of Systems ?Review of Systems ? ? ?Physical Exam ?Triage Vital Signs ?ED Triage Vitals  ?Enc Vitals Group  ?   BP 03/11/22  0846 106/71  ?   Pulse Rate 03/11/22 0846 (!) 56  ?   Resp 03/11/22 0846 15  ?   Temp 03/11/22 0846 98.2 ?F (36.8 ?C)  ?   Temp src --   ?   SpO2 03/11/22 0846 98 %  ?   Weight --   ?   Height --   ?   Head Circumference --   ?   Peak Flow --   ?   Pain Score 03/11/22 0843 9  ?   Pain Loc --   ?   Pain Edu? --   ?   Excl. in GC? --   ? ?No data found. ? ?Updated Vital Signs ?BP 106/71 (BP Location: Left Arm)   Pulse (!) 56   Temp 98.2 ?F (36.8 ?C)   Resp 15   LMP 03/09/2022   SpO2 98%  ? ?Visual Acuity ?Right Eye Distance:   ?Left Eye Distance:   ?Bilateral Distance:   ? ?Right Eye Near:   ?Left Eye Near:    ?Bilateral Near:    ? ?Physical Exam ?Vitals reviewed.  ?Constitutional:   ?   General: She is not in acute distress. ?   Appearance: She is not ill-appearing, toxic-appearing or diaphoretic.  ?HENT:  ?  Mouth/Throat:  ?   Mouth: Mucous membranes are moist.  ?Cardiovascular:  ?   Rate and Rhythm: Normal rate and regular rhythm.  ?   Heart sounds: No murmur heard. ?Pulmonary:  ?   Effort: No respiratory distress.  ?   Breath sounds: No stridor. No wheezing, rhonchi or rales.  ?Abdominal:  ?   Palpations: Abdomen is soft.  ?   Tenderness: There is abdominal tenderness (Mild in epigastric area).  ?Musculoskeletal:  ?   Cervical back: Neck supple.  ?Lymphadenopathy:  ?   Cervical: No cervical adenopathy.  ?Skin: ?   Capillary Refill: Capillary refill takes less than 2 seconds.  ?   Coloration: Skin is not jaundiced or pale.  ?Neurological:  ?   General: No focal deficit present.  ?   Mental Status: She is alert and oriented to person, place, and time.  ?Psychiatric:     ?   Behavior: Behavior normal.  ? ? ? ?UC Treatments / Results  ?Labs ?(all labs ordered are listed, but only abnormal results are displayed) ?Labs Reviewed - No data to display ? ?EKG ? ? ?Radiology ?No results found. ? ?Procedures ?Procedures (including critical care time) ? ?Medications Ordered in UC ?Medications - No data to  display ? ?Initial Impression / Assessment and Plan / UC Course  ?I have reviewed the triage vital signs and the nursing notes. ? ?Pertinent labs & imaging results that were available during my care of the patient were reviewed by me and considered in my medical decision making (see chart for details). ? ?  ? ?I will increase her pantoprazole dose, and also send in a couple of weeks worth of Carafate.  I have asked her to have a follow-up appointment with her primary care doctor ?Final Clinical Impressions(s) / UC Diagnoses  ? ?Final diagnoses:  ?Abdominal pain, epigastric  ? ? ? ?Discharge Instructions   ? ?  ?Increase pantoprazole to 40 mg--taken 1 daily every morning ? ?Sucralfate 1 g is also for gastritis--take 1 morning and evening, or twice daily ? ?Ondansetron dissolved in the mouth every 8 hours as needed for nausea or vomiting. ?Avoid acidic, spicy, or greasy foods ? ? ? ? ?ED Prescriptions   ? ? Medication Sig Dispense Auth. Provider  ? pantoprazole (PROTONIX) 40 MG tablet Take 1 tablet (40 mg total) by mouth daily. 30 tablet Barrett Henle, MD  ? sucralfate (CARAFATE) 1 g tablet Take 1 tablet (1 g total) by mouth 2 (two) times daily. 30 tablet Barrett Henle, MD  ? ondansetron (ZOFRAN-ODT) 4 MG disintegrating tablet Take 1 tablet (4 mg total) by mouth every 8 (eight) hours as needed for nausea or vomiting. 10 tablet Barrett Henle, MD  ? ?  ? ?PDMP not reviewed this encounter. ?  ?Barrett Henle, MD ?03/11/22 [phone] ? ?

## 2022-03-11 NOTE — ED Triage Notes (Signed)
Pt reports has stomach condition that starts with "g" and needing medication refill of zofran and protonix.  ?Pt also wanting paper work for light duty at work for the next couple days  ?

## 2022-03-11 NOTE — Discharge Instructions (Addendum)
Increase pantoprazole to 40 mg--taken 1 daily every morning ? ?Sucralfate 1 g is also for gastritis--take 1 morning and evening, or twice daily ? ?Ondansetron dissolved in the mouth every 8 hours as needed for nausea or vomiting. ?Avoid acidic, spicy, or greasy foods ?
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
## 2022-05-08 ENCOUNTER — Emergency Department (HOSPITAL_COMMUNITY): Payer: Medicaid Other

## 2022-05-08 ENCOUNTER — Emergency Department (HOSPITAL_COMMUNITY)
Admission: EM | Admit: 2022-05-08 | Discharge: 2022-05-08 | Disposition: A | Discharge status: Home / Self Care | Payer: Medicaid Other | Attending: Emergency Medicine | Admitting: Emergency Medicine

## 2022-05-08 ENCOUNTER — Encounter (HOSPITAL_COMMUNITY): Payer: Self-pay

## 2022-05-08 ENCOUNTER — Other Ambulatory Visit: Payer: Self-pay

## 2022-05-08 DIAGNOSIS — Y990 Civilian activity done for income or pay: Secondary | ICD-10-CM | POA: Insufficient documentation

## 2022-05-08 DIAGNOSIS — X58XXXA Exposure to other specified factors, initial encounter: Secondary | ICD-10-CM | POA: Insufficient documentation

## 2022-05-08 DIAGNOSIS — M25511 Pain in right shoulder: Secondary | ICD-10-CM | POA: Diagnosis not present

## 2022-05-08 MED ORDER — LIDOCAINE 5 % EX PTCH
1.0000 | MEDICATED_PATCH | CUTANEOUS | Status: DC
Start: 2022-05-08 — End: 2022-05-09
  Administered 2022-05-08: 1 via TRANSDERMAL
  Filled 2022-05-08: qty 1

## 2022-05-08 MED ORDER — IBUPROFEN 800 MG PO TABS
800.0000 mg | ORAL_TABLET | Freq: Once | ORAL | Status: AC
Start: 2022-05-08 — End: 2022-05-08
  Administered 2022-05-08: 800 mg via ORAL
  Filled 2022-05-08: qty 1

## 2022-05-08 NOTE — ED Provider Notes (Signed)
[facility] Provider Note   CSN: [id number] Arrival date & time: 05/08/22  2101     History  Chief Complaint  Patient presents with   Shoulder Injury    Sharon Day is a 26 y.o. female.   Shoulder Injury    Patient presents with right shoulder pain.  This happened acutely today while at work, she drives a Chief Executive Officer.  She has been having pain with any movement of the shoulder.  Denies any chest pain or shortness of breath, no direct blunt injury to the shoulder.  Denies any paresthesias.  The pain also was present when she moves her neck, moves to her right forearm.  Right-hand-dominant.  Home Medications Prior to Admission medications   Medication Sig Start Date End Date Taking? Authorizing Provider  acetaminophen (TYLENOL) 500 MG tablet Take 500 mg by mouth every 6 (six) hours as needed for mild pain.     [provider]  ondansetron (ZOFRAN-ODT) 4 MG disintegrating tablet Take 1 tablet (4 mg total) by mouth every 8 (eight) hours as needed for nausea or vomiting. 03/11/22   Zenia Resides, MD  pantoprazole (PROTONIX) 40 MG tablet Take 1 tablet (40 mg total) by mouth daily. 03/11/22   Zenia Resides, MD  sucralfate (CARAFATE) 1 g tablet Take 1 tablet (1 g total) by mouth 2 (two) times daily. 03/11/22   Zenia Resides, MD      Allergies    Patient has no known allergies.    Review of Systems   Review of Systems  Physical Exam Updated Vital Signs BP (!) 120/94 (BP Location: Left Arm)   Pulse 80   Temp 98.2 F (36.8 C) (Oral)   Resp 20   Ht 5\' 2"  (1.575 m)   Wt 51.3 kg   LMP 04/11/2022   SpO2 100%   BMI 20.69 kg/m  Physical Exam Vitals and nursing note reviewed. Exam conducted with a chaperone present.  Constitutional:      Appearance: Normal appearance.  HENT:     Head: Normocephalic and atraumatic.  Eyes:     General: No scleral icterus.       Right eye: No discharge.        Left eye: No discharge.      Extraocular Movements: Extraocular movements intact.     Pupils: Pupils are equal, round, and reactive to light.  Cardiovascular:     Rate and Rhythm: Normal rate and regular rhythm.     Pulses: Normal pulses.     Heart sounds: Normal heart sounds. No murmur heard.    No friction rub. No gallop.  Pulmonary:     Effort: Pulmonary effort is normal. No respiratory distress.     Breath sounds: Normal breath sounds.  Abdominal:     General: Abdomen is flat. Bowel sounds are normal. There is no distension.     Palpations: Abdomen is soft.     Tenderness: There is no abdominal tenderness.  Musculoskeletal:        General: Tenderness present.     Comments: Tolerates passive ROM to the shoulder, some pain with active ROM in all directions.  There is no point tenderness or crepitus, no contusions or palpable deformities.  Moves elbow and wrist without any difficulty.  No significant midline tenderness to the C-spine.  Skin:    General: Skin is warm and dry.     Coloration: Skin is not jaundiced.  Neurological:     Mental Status: She is  alert. Mental status is at baseline.     Coordination: Coordination normal.     ED Results / Procedures / Treatments   Labs (all labs ordered are listed, but only abnormal results are displayed) Labs Reviewed - No data to display  EKG None  Radiology DG Shoulder Right  Result Date: 05/08/2022 CLINICAL DATA:  Right shoulder pain. EXAM: RIGHT SHOULDER - 2+ VIEW COMPARISON:  None Available. FINDINGS: There is no evidence of fracture or dislocation. There is no evidence of arthropathy or other focal bone abnormality. Soft tissues are unremarkable. IMPRESSION: Negative. Electronically Signed   By: Aram Candela M.D.   On: 05/08/2022 21:54    Procedures Procedures    Medications Ordered in ED Medications  lidocaine (LIDODERM) 5 % 1 patch (has no administration in time range)  ibuprofen (ADVIL) tablet 800 mg (has no administration in time range)     ED Course/ Medical Decision Making/ A&P                           Medical Decision Making Amount and/or Complexity of Data Reviewed Radiology: ordered.  Risk Prescription drug management.   Patient presents due to right shoulder pain.  Differential includes MSK, dislocation, fracture, radiculopathy.  Patient is neurovascular intact with brisk cap refill.  Tolerates passive ROM, do not think any infectious etiology especially given the contacts.  X-ray was ordered, negative for any dislocation or fracture.  I think patient's pain is most likely musculoskeletal, Lidoderm patch and Motrin given here.  Work note provided, I recommended Tylenol and Motrin for pain as well as icing and rest.  Patient discharged in stable condition.        Final Clinical Impression(s) / ED Diagnoses Final diagnoses:  Acute pain of right shoulder    Rx / DC Orders ED Discharge Orders     None         Theron Arista, PA-C 05/08/22 2232    Rolan Bucco, MD 05/08/22 2239
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
## 2022-05-08 NOTE — Discharge Instructions (Signed)
Take tylenol and motrin for pain. Follow up with primary if no improvement in a week

## 2022-05-25 ENCOUNTER — Emergency Department (HOSPITAL_COMMUNITY)
Admission: EM | Admit: 2022-05-25 | Discharge: 2022-05-26 | Disposition: A | Discharge status: Home / Self Care | Payer: Medicaid Other | Attending: Emergency Medicine | Admitting: Emergency Medicine

## 2022-05-25 DIAGNOSIS — X500XXA Overexertion from strenuous movement or load, initial encounter: Secondary | ICD-10-CM | POA: Diagnosis not present

## 2022-05-25 DIAGNOSIS — I2510 Atherosclerotic heart disease of native coronary artery without angina pectoris: Secondary | ICD-10-CM | POA: Diagnosis not present

## 2022-05-25 DIAGNOSIS — Y990 Civilian activity done for income or pay: Secondary | ICD-10-CM | POA: Insufficient documentation

## 2022-05-25 DIAGNOSIS — R079 Chest pain, unspecified: Secondary | ICD-10-CM | POA: Diagnosis not present

## 2022-05-25 DIAGNOSIS — M940 Chondrocostal junction syndrome [Tietze]: Secondary | ICD-10-CM | POA: Diagnosis not present

## 2022-05-26 ENCOUNTER — Other Ambulatory Visit: Payer: Self-pay

## 2022-05-26 ENCOUNTER — Encounter (HOSPITAL_COMMUNITY): Payer: Self-pay

## 2022-05-26 ENCOUNTER — Emergency Department (HOSPITAL_COMMUNITY): Payer: Medicaid Other

## 2022-05-26 DIAGNOSIS — R079 Chest pain, unspecified: Secondary | ICD-10-CM | POA: Diagnosis not present

## 2022-05-26 MED ORDER — NAPROXEN 375 MG PO TABS: ORAL_TABLET | ORAL | 0 refills | Status: DC

## 2022-05-26 MED ORDER — NAPROXEN 500 MG PO TABS
500.0000 mg | ORAL_TABLET | Freq: Once | ORAL | Status: AC
Start: 2022-05-26 — End: 2022-05-26
  Administered 2022-05-26: 500 mg via ORAL
  Filled 2022-05-26: qty 1

## 2022-05-26 NOTE — ED Notes (Signed)
Pt A&OX4 ambulatory at d/c with independent steady gait. Pt verbalized understanding of d/c instructions, prescription and follow up care. 

## 2022-05-26 NOTE — ED Notes (Signed)
Patient transported to X-ray 

## 2022-05-26 NOTE — ED Triage Notes (Signed)
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   Pain worst with palpation. Denies shob, fall, injury or heavy lifting.

## 2022-05-26 NOTE — ED Provider Notes (Signed)
[facility]-EMERGENCY DEPT Provider Note: Lowella Dell, MD, FACEP  CSN: [id number] MRN: [id number] ARRIVAL: 05/25/22 at 2359 ROOM: [facility]08/[facility]08   CHIEF COMPLAINT  Chest Pain   HISTORY OF PRESENT ILLNESS  05/26/22 12:16 AM Sharon Day is a 26 y.o. female who developed right-sided chest pain yesterday evening at work about 6:30 PM.  She was driving a forklift at the time.  Pain is worse with palpation.  She denies trauma or heavy lifting.  She is not having shortness of breath.  She rates her pain as a 6 out of 10.    History reviewed. No pertinent past medical history.  History reviewed. No pertinent surgical history.  Family History  Family history unknown: Yes    Social History   Tobacco Use   Smoking status: Never   Smokeless tobacco: Never  Substance Use Topics   Alcohol use: Yes   Drug use: Never    Prior to Admission medications   Medication Sig Start Date End Date Taking? Authorizing Provider  naproxen (NAPROSYN) 375 MG tablet Take 1 tablet twice daily as needed for chest pain. 05/26/22  Yes Kion Huntsberry, MD  acetaminophen (TYLENOL) 500 MG tablet Take 500 mg by mouth every 6 (six) hours as needed for mild pain.     [provider]  ondansetron (ZOFRAN-ODT) 4 MG disintegrating tablet Take 1 tablet (4 mg total) by mouth every 8 (eight) hours as needed for nausea or vomiting. 03/11/22   Zenia Resides, MD  pantoprazole (PROTONIX) 40 MG tablet Take 1 tablet (40 mg total) by mouth daily. 03/11/22   Zenia Resides, MD  sucralfate (CARAFATE) 1 g tablet Take 1 tablet (1 g total) by mouth 2 (two) times daily. 03/11/22   Zenia Resides, MD    Allergies Patient has no known allergies.   REVIEW OF SYSTEMS  Negative except as noted here or in the History of Present Illness.   PHYSICAL EXAMINATION  Initial Vital Signs Blood pressure 112/89, pulse 65, temperature 98.4 F (36.9 C), temperature source Oral, resp. rate (!) 22, height 5\' 2"  (1.575 m), weight 51.3 kg,  last menstrual period 05/07/2022, SpO2 100 %.  Examination General: Well-developed, well-nourished female in no acute distress; appearance consistent with age of record HENT: normocephalic; atraumatic Eyes: normal appearance Neck: supple Heart: regular rate and rhythm Lungs: clear to auscultation bilaterally Chest: Right upper rib tenderness Abdomen: soft; nondistended; nontender; bowel sounds present Extremities: No deformity; full range of motion; pulses normal Neurologic: Awake, alert and oriented; motor function intact in all extremities and symmetric; no facial droop Skin: Warm and dry Psychiatric: Normal mood and affect   RESULTS  Summary of this visit's results, reviewed and interpreted by myself:   EKG Interpretation  Date/Time:  Monday May 26 2022 00:04:54 EDT Ventricular Rate:  63 PR Interval:  146 QRS Duration: 87 QT Interval:  388 QTC Calculation: 398 R Axis:   49 Text Interpretation: Sinus rhythm Normal ECG No significant change was found Confirmed by Annelisa Ryback ([id number]) on 05/26/2022 12:12:04 AM       Laboratory Studies: No results found for this or any previous visit (from the past 24 hour(s)). Imaging Studies: DG Ribs Unilateral W/Chest Right  Result Date: 05/26/2022 CLINICAL DATA:  Right-sided chest pain. EXAM: RIGHT RIBS AND CHEST - 3+ VIEW COMPARISON:  05/31/2020. FINDINGS: No fracture or other bone lesions are seen involving the ribs. There is no evidence of pneumothorax or pleural effusion. Both lungs are clear. Heart size and mediastinal contours  are within normal limits. IMPRESSION: Negative. Electronically Signed   By: Thornell Sartorius M.D.   On: 05/26/2022 00:44    ED COURSE and MDM  Nursing notes, initial and subsequent vitals signs, including pulse oximetry, reviewed and interpreted by myself.  Vitals:   05/26/22 0003 05/26/22 0005  BP:  112/89  Pulse:  65  Resp:  (!) 22  Temp:  98.4 F (36.9 C)  TempSrc:  Oral  SpO2:  100%  Weight: 51.3  kg   Height: 5\' 2"  (1.575 m)    Medications  naproxen (NAPROSYN) tablet 500 mg (500 mg Oral Given 05/26/22 0045)   Presentation consistent with costochondritis.  The patient's pain is reproducible and costochondritis is a common cause of reproducible chest pain in young women.  Her EKG is normal and she has no risk factors for coronary artery disease.  We will treat her with naproxen.   PROCEDURES  Procedures   ED DIAGNOSES     ICD-10-CM   1. Costochondritis  M94.0          Pilot Prindle, 05/28/22, MD 05/26/22 [phone]

## 2022-06-15 ENCOUNTER — Emergency Department (HOSPITAL_COMMUNITY)
Admission: EM | Admit: 2022-06-15 | Discharge: 2022-06-16 | Disposition: A | Discharge status: Home / Self Care | Payer: Medicaid Other | Attending: Emergency Medicine | Admitting: Emergency Medicine

## 2022-06-15 ENCOUNTER — Encounter (HOSPITAL_COMMUNITY): Payer: Self-pay

## 2022-06-15 ENCOUNTER — Other Ambulatory Visit: Payer: Self-pay

## 2022-06-15 DIAGNOSIS — R55 Syncope and collapse: Secondary | ICD-10-CM | POA: Insufficient documentation

## 2022-06-15 DIAGNOSIS — R519 Headache, unspecified: Secondary | ICD-10-CM | POA: Insufficient documentation

## 2022-06-15 LAB — I-STAT BETA HCG BLOOD, ED (MC, WL, AP ONLY): I-stat hCG, quantitative: <5 m[IU]/mL (ref <5)

## 2022-06-15 MED ORDER — DIPHENHYDRAMINE HCL 50 MG/ML IJ SOLN
25.0000 mg | Freq: Once | INTRAMUSCULAR | Status: AC
  Administered 2022-06-15: 25 mg via INTRAVENOUS
  Filled 2022-06-15: qty 1

## 2022-06-15 MED ORDER — METOCLOPRAMIDE HCL 5 MG/ML IJ SOLN
5.0000 mg | Freq: Once | INTRAMUSCULAR | Status: AC
  Administered 2022-06-15: 5 mg via INTRAVENOUS
  Filled 2022-06-15: qty 2

## 2022-06-15 MED ORDER — SODIUM CHLORIDE 0.9 % IV BOLUS
1000.0000 mL | Freq: Once | INTRAVENOUS | Status: AC
  Administered 2022-06-15: 1000 mL via INTRAVENOUS

## 2022-06-15 NOTE — ED Provider Notes (Signed)
[facility] DEPT Provider Note   CSN: [id number] Arrival date & time: 06/15/22  2240     History  Chief Complaint  Patient presents with   Blurred Vision    Sharon Day is a 26 y.o. female.  Patient as above with significant medical history as below, including gastritis, costochondritis who presents to the ED with complaint of near syncope.  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  She felt she was going to pass out, she felt as though her vision began to get blurry, she felt hot and sweaty, had a headache.  She stopped working, sat down for a few moments and felt better.  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  Total duration of symptoms was approximately 30 minutes.  Vision has returned to baseline.  Prior symptoms in the past attributed to not wearing her glasses.  Reports the blurry vision was binocular.  No falls, numbness, tingling, head injuries, nausea or vomiting.  No recent medication or diet changes.  No recent licit drug use.  No recent alcohol use.    At this time she has a headache described as throbbing, pulsatile, frontal/ bitemporal but otherwise is asymptomatic.     History reviewed. No pertinent past medical history.  History reviewed. No pertinent surgical history.   The history is provided by the patient. No language interpreter was used.       Home Medications Prior to Admission medications   Medication Sig Start Date End Date Taking? Authorizing Provider  acetaminophen (TYLENOL) 500 MG tablet Take 500 mg by mouth every 6 (six) hours as needed for mild pain.    Yes [provider]  metoCLOPramide (REGLAN) 5 MG tablet Take 1 tablet (5 mg total) by mouth every 8 (eight) hours as needed for nausea. 06/16/22  Yes Wynona Dove A, DO  naproxen (NAPROSYN) 375 MG tablet Take 1 tablet twice daily as  needed for chest pain. Patient not taking: Reported on 06/15/2022 05/26/22   Molpus, Jenny Reichmann, MD  ondansetron (ZOFRAN-ODT) 4 MG disintegrating tablet Take 1 tablet (4 mg total) by mouth every 8 (eight) hours as needed for nausea or vomiting. Patient not taking: Reported on 06/15/2022 03/11/22   Barrett Henle, MD  pantoprazole (PROTONIX) 40 MG tablet Take 1 tablet (40 mg total) by mouth daily. Patient not taking: Reported on 06/15/2022 03/11/22   Barrett Henle, MD  sucralfate (CARAFATE) 1 g tablet Take 1 tablet (1 g total) by mouth 2 (two) times daily. Patient not taking: Reported on 06/15/2022 03/11/22   Barrett Henle, MD      Allergies    Patient has no known allergies.    Review of Systems   Review of Systems  Constitutional:  Positive for diaphoresis. Negative for chills and fever.  HENT:  Negative for facial swelling and trouble swallowing.   Eyes:  Negative for photophobia and visual disturbance.  Respiratory:  Negative for cough and shortness of breath.   Cardiovascular:  Negative for chest pain and palpitations.  Gastrointestinal:  Negative for abdominal pain, nausea and vomiting.  Endocrine: Negative for polydipsia and polyuria.  Genitourinary:  Negative for difficulty urinating and hematuria.  Musculoskeletal:  Negative for gait problem and joint swelling.  Skin:  Negative for pallor and rash.  Neurological:  Positive for headaches. Negative for syncope.  Near syncope  Psychiatric/Behavioral:  Negative for agitation and confusion.     Physical Exam Updated Vital Signs BP 102/77   Pulse (!) 46   Temp 97.8 F (36.6 C) (Oral)   Resp (!) 9   Ht 5\' 3"  (1.6 m)   Wt 47.6 kg   LMP 05/07/2022 (Approximate)   SpO2 98%   BMI 18.60 kg/m  Physical Exam Vitals and nursing note reviewed.  Constitutional:      General: She is not in acute distress.    Appearance: Normal appearance. She is not ill-appearing or diaphoretic.  HENT:     Head: Normocephalic and atraumatic.      Right Ear: External ear normal.     Left Ear: External ear normal.     Nose: Nose normal.     Mouth/Throat:     Mouth: Mucous membranes are moist.  Eyes:     General: Vision grossly intact. Gaze aligned appropriately. No scleral icterus.       Right eye: No discharge.        Left eye: No discharge.     Extraocular Movements: Extraocular movements intact.     Right eye: Normal extraocular motion.     Left eye: Normal extraocular motion.     Conjunctiva/sclera: Conjunctivae normal.     Pupils: Pupils are equal, round, and reactive to light.  Cardiovascular:     Rate and Rhythm: Normal rate and regular rhythm.     Pulses: Normal pulses.          Radial pulses are 2+ on the right side and 2+ on the left side.       Posterior tibial pulses are 2+ on the right side and 2+ on the left side.     Heart sounds: Normal heart sounds.  Pulmonary:     Effort: Pulmonary effort is normal. No respiratory distress.     Breath sounds: Normal breath sounds.  Abdominal:     General: Abdomen is flat.     Palpations: Abdomen is soft.     Tenderness: There is no abdominal tenderness. There is no guarding or rebound.  Musculoskeletal:        General: Normal range of motion.     Cervical back: Full passive range of motion without pain and normal range of motion.     Right lower leg: No edema.     Left lower leg: No edema.  Skin:    General: Skin is warm and dry.     Capillary Refill: Capillary refill takes less than 2 seconds.  Neurological:     Mental Status: She is alert and oriented to person, place, and time.     GCS: GCS eye subscore is 4. GCS verbal subscore is 5. GCS motor subscore is 6.     Cranial Nerves: Cranial nerves 2-12 are intact. No dysarthria or facial asymmetry.     Sensory: Sensation is intact. No sensory deficit.     Motor: Motor function is intact. No tremor.     Coordination: Coordination is intact. Coordination normal.     Gait: Gait is intact.     Comments: Strength 5/5  bilateral upper extremities to flexion/extension Strength 5/5 to bilateral lower extremities  Psychiatric:        Mood and Affect: Mood normal.        Behavior: Behavior normal.     ED Results / Procedures / Treatments   Labs (all labs ordered are listed, but only abnormal results are displayed) Labs  Reviewed  BASIC METABOLIC PANEL - Abnormal; Notable for the following components:      Result Value   Calcium 8.2 (*)    All other components within normal limits  MAGNESIUM - Abnormal; Notable for the following components:   Magnesium 1.6 (*)    All other components within normal limits  CBC WITH DIFFERENTIAL/PLATELET  I-STAT BETA HCG BLOOD, ED ([facility], [facility], [facility] ONLY)    EKG None  Radiology No results found.  Procedures Procedures    Medications Ordered in ED Medications  metoCLOPramide (REGLAN) injection 5 mg (5 mg Intravenous Given 06/15/22 2354)  diphenhydrAMINE (BENADRYL) injection 25 mg (25 mg Intravenous Given 06/15/22 2353)  sodium chloride 0.9 % bolus 1,000 mL (0 mLs Intravenous Stopped 06/16/22 0103)    ED Course/ Medical Decision Making/ A&P Clinical Course as of 06/16/22 0308  Mon Jun 16, 2022  0040 Visual acuity is WNL, equal b/l [SG]    Clinical Course User Index [SG] Sloan Leiter, DO                           Medical Decision Making Amount and/or Complexity of Data Reviewed Labs: ordered. ECG/medicine tests: ordered.  Risk Prescription drug management.   This patient presents to the ED with chief complaint(s) of blurry vision, near syncope with pertinent past medical history of gastritis/costochondritis which further complicates the presenting complaint. The complaint involves an extensive differential diagnosis and also carries with it a high risk of complications and morbidity.    The differential diagnosis includes but not limited to ACS, vasovagal, dehydration, metabolic derangement, electrolyte disturbance, psychogenic, infectious, etc. Serious  etiologies were considered.   The initial plan is to screening labs, IV fluids, migraine cocktail   Additional history obtained: Additional history obtained from  n/a Records reviewed  prior ed visits, prior labs/imaging   Independent labs interpretation:  The following labs were independently interpreted: Magnesium is slightly low.  Labs otherwise unremarkable  Independent visualization of imaging: Cardiac monitoring was reviewed and interpreted by myself which shows NSR  ECG w/o acute ischemic changes   Treatment and Reassessment: Visual acuity normal Reglan, Benadryl, IV fluids >> Her symptoms have resolved  Consultation: - Consulted or discussed management/test interpretation w/ external professional: n/a  Consideration for admission or further workup: Admission was considered    Repeat neurologic assessment is nonfocal, no deficits.  She is not having any vision abnormalities at this time.  Headache is resolved.  She is ambulatory with a steady gait.  ECG is without arrhythmia or acute ischemic changes.  No chest pain or dyspnea.  Fevers or chills.  Did not have LOC.  No significant cardiac risk factors.  Gait steady, tolerating PO   Avoid thc use  Patient presents with near syncopal symptoms without worrisome features. Presentation most suggestive of neuro-cardiogenic or orthostatic cause. Very low suspicion for serious arrhythmia, cardiac ischemia or other serious etiology. ECG reviewed, no evidence of a cardiac arrhythmia such as Brugada, WPW, HOCM, IHSS, Long or short QT. Neurologic exam is nonfocal, not consistent with CVA or primary neurologic abnormality. Patient appears safe for discharge with outpatient observation and close PCP F/U. Syncope warnings discussed with patient. The patient has been instructed to return immediately if the symptoms worsen in any way. Patient verbalized understanding and is in agreement with current care plan. All questions answered prior  to discharge.  Advised patient to increase her p.o. fluid intake.  Take frequent breaks while at  work.  Follow-up with PCP.     Social Determinants of health: Social History   Tobacco Use   Smoking status: Never   Smokeless tobacco: Never  Substance Use Topics   Alcohol use: Not Currently   Drug use: Yes    Types: Marijuana            Final Clinical Impression(s) / ED Diagnoses Final diagnoses:  Near syncope    Rx / DC Orders ED Discharge Orders          Ordered    metoCLOPramide (REGLAN) 5 MG tablet  Every 8 hours PRN        06/16/22 0306              Jeanell Sparrow, DO 06/16/22 0308

## 2022-06-15 NOTE — ED Triage Notes (Signed)
Patient said that she was at work and her vision got blurry. She said she ate normal today. Feeling like she was going to pass out.

## 2022-06-16 LAB — BASIC METABOLIC PANEL
Anion gap: 5 (ref 5–15)
BUN: 11 mg/dL (ref 6–20)
CO2: 25 mmol/L (ref 22–32)
Calcium: 8.2 mg/dL — ABNORMAL LOW (ref 8.9–10.3)
Chloride: 111 mmol/L (ref 98–111)
Creatinine, Ser: 0.86 mg/dL (ref 0.44–1.00)
GFR, Estimated: >60 mL/min (ref >60)
Glucose, Bld: 85 mg/dL (ref 70–99)
Potassium: 3.7 mmol/L (ref 3.5–5.1)
Sodium: 141 mmol/L (ref 135–145)

## 2022-06-16 LAB — CBC WITH DIFFERENTIAL/PLATELET
Abs Immature Granulocytes: 0.01 10*3/uL (ref 0.00–0.07)
Basophils Absolute: 0 10*3/uL (ref 0.0–0.1)
Basophils Relative: 1 %
Eosinophils Absolute: 0.1 10*3/uL (ref 0.0–0.5)
Eosinophils Relative: 1 %
HCT: 40.1 % (ref 36.0–46.0)
Hemoglobin: 13.1 g/dL (ref 12.0–15.0)
Immature Granulocytes: 0 %
Lymphocytes Relative: 53 %
Lymphs Abs: 2.6 10*3/uL (ref 0.7–4.0)
MCH: 29.9 pg (ref 26.0–34.0)
MCHC: 32.7 g/dL (ref 30.0–36.0)
MCV: 91.6 fL (ref 80.0–100.0)
Monocytes Absolute: 0.4 10*3/uL (ref 0.1–1.0)
Monocytes Relative: 7 %
Neutro Abs: 1.8 10*3/uL (ref 1.7–7.7)
Neutrophils Relative %: 38 %
Platelets: UNDETERMINED 10*3/uL (ref 150–400)
RBC: 4.38 MIL/uL (ref 3.87–5.11)
RDW: 12.7 % (ref 11.5–15.5)
Smear Review: UNDETERMINED
WBC: 4.8 10*3/uL (ref 4.0–10.5)
nRBC: 0 % (ref 0.0–0.2)

## 2022-06-16 LAB — MAGNESIUM: Magnesium: 1.6 mg/dL — ABNORMAL LOW (ref 1.7–2.4)

## 2022-06-16 MED ORDER — METOCLOPRAMIDE HCL 5 MG PO TABS: 5.0000 mg | ORAL_TABLET | Freq: Three times a day (TID) | ORAL | 0 refills | Status: DC | PRN

## 2022-06-16 NOTE — ED Notes (Signed)
Light green top recollected and sent to lab.

## 2022-06-16 NOTE — Discharge Instructions (Addendum)
It was a pleasure caring for you today in the emergency department.  Please return to the emergency department for any worsening or worrisome symptoms.  Please increase your liquid intake while at work, take frequent breaks while at work. Please avoid use of THC

## 2022-06-16 NOTE — ED Notes (Signed)
Patient reports she is feeling better and vision has remained normal.

## 2022-06-16 NOTE — ED Notes (Signed)
Patient ambulated to restroom with steady gait.

## 2022-06-28 ENCOUNTER — Ambulatory Visit (HOSPITAL_COMMUNITY)
Admission: EM | Admit: 2022-06-28 | Discharge: 2022-06-28 | Disposition: A | Discharge status: Home / Self Care | Payer: Medicaid Other | Attending: Family Medicine | Admitting: Family Medicine

## 2022-06-28 ENCOUNTER — Encounter (HOSPITAL_COMMUNITY): Payer: Self-pay

## 2022-06-28 DIAGNOSIS — R0981 Nasal congestion: Secondary | ICD-10-CM

## 2022-06-28 DIAGNOSIS — R109 Unspecified abdominal pain: Secondary | ICD-10-CM | POA: Diagnosis not present

## 2022-06-28 DIAGNOSIS — R519 Headache, unspecified: Secondary | ICD-10-CM | POA: Diagnosis not present

## 2022-06-28 LAB — POCT URINALYSIS DIPSTICK, ED / UC
Bilirubin Urine: NEGATIVE
Glucose, UA: NEGATIVE mg/dL
Hgb urine dipstick: NEGATIVE
Ketones, ur: NEGATIVE mg/dL
Leukocytes,Ua: NEGATIVE
Nitrite: NEGATIVE
Protein, ur: NEGATIVE mg/dL
Specific Gravity, Urine: 1.02 (ref 1.005–1.030)
Urobilinogen, UA: 1 mg/dL (ref 0.0–1.0)
pH: 8.5 — ABNORMAL HIGH (ref 5.0–8.0)

## 2022-06-28 LAB — POC URINE PREG, ED: Preg Test, Ur: NEGATIVE

## 2022-06-28 NOTE — Discharge Instructions (Signed)
You may try taking an allergy medicine such as Claritin-D twice daily to see if this helps your nasal congestion.

## 2022-06-28 NOTE — ED Triage Notes (Addendum)
Patient having a headache and sinus pressure. States has pain with blowing her nose. Onset yesterday afternoon, Patient left work because she could not focus.   No history of sinus infections or seasonal allergies. No known sick exposure.   Patient took some ibuprofen last night and this did not help her symptoms.   Right side flank pain. Patient had 2 periods this month. First period was at th beginning of the month and more like bloody discharge. Patient had a full period one week ago.

## 2022-06-30 NOTE — ED Provider Notes (Signed)
  [facility]   [id number] 06/28/22 Arrival Time: 1200  ASSESSMENT & PLAN:  1. Acute nonintractable headache, unspecified headache type   2. Nasal congestion   3. Right flank discomfort    Suspect seasonal allergies as cause of her current symptoms. Recommend OTC Claritin-D for the next 1-2 weeks.  Discussed typical duration of symptoms. OTC symptom care as needed. Ensure adequate fluid intake and rest.  For R flank discomfort: UPT negative. U/A without signs of infection. Symptoms are not currently present.   Follow-up Information     Center, [facility]   Why: As needed. Contact information: [address] [facility] [address] [facility] [address] [place] [address] [phone]                 Reviewed expectations re: course of current medical issues. Questions answered. Outlined signs and symptoms indicating need for more acute intervention. Patient verbalized understanding. After Visit Summary given.   SUBJECTIVE: History from: patient.  Sharon Day is a 26 y.o. female who reports having a headache and sinus pressure. States has pain with blowing her nose. Onset yesterday afternoon, Patient left work because she could not focus.   No history of sinus infections or seasonal allergies. No known sick exposure.   Patient took some ibuprofen last night and this did not help her symptoms.   Right side flank pain. Patient had 2 periods this month. First period was at th beginning of the month and more like bloody discharge. Patient had a full period one week ago.    Social History   Tobacco Use  Smoking Status Never  Smokeless Tobacco Never     OBJECTIVE:  Vitals:   06/28/22 1324 06/28/22 1327  BP: 112/73   Pulse: (!) 58   Resp: 16   Temp: 97.9 F (36.6 C)   TempSrc: Oral   SpO2: 98%   Weight:  44.5 kg  Height:  5\' 3"  (1.6 m)     General appearance: alert; no distress HEENT: nasal congestion; clear runny nose; some nasal  congestion Neck: supple without LAD; trachea midline Abd: soft; NT GU: deferred Lungs: unlabored respirations, symmetrical air entry; cough: absent; no respiratory distress Skin: warm and dry Psychological: alert and cooperative; normal mood and affect  No Known Allergies  History reviewed. No pertinent past medical history. Family History  Family history unknown: Yes   Social History   Socioeconomic History   Marital status: Significant Other    Spouse name: Not on file   Number of children: Not on file   Years of education: Not on file   Highest education level: Not on file  Occupational History   Not on file  Tobacco Use   Smoking status: Never   Smokeless tobacco: Never  Substance and Sexual Activity   Alcohol use: Not Currently   Drug use: Yes    Types: Marijuana   Sexual activity: Yes  Other Topics Concern   Not on file  Social History Narrative   Not on file   Social Determinants of Health   Financial Resource Strain: Not on file  Food Insecurity: Not on file  Transportation Needs: Not on file  Physical Activity: Not on file  Stress: Not on file  Social Connections: Not on file  Intimate Partner Violence: Not on file             , MD 06/30/22 [phone]

## 2022-07-13 ENCOUNTER — Emergency Department (HOSPITAL_COMMUNITY)
Admission: EM | Admit: 2022-07-13 | Discharge: 2022-07-14 | Disposition: A | Discharge status: Home / Self Care | Payer: Medicaid Other | Attending: Emergency Medicine | Admitting: Emergency Medicine

## 2022-07-13 ENCOUNTER — Emergency Department (HOSPITAL_COMMUNITY): Payer: Medicaid Other

## 2022-07-13 ENCOUNTER — Encounter (HOSPITAL_COMMUNITY): Payer: Self-pay | Admitting: Emergency Medicine

## 2022-07-13 ENCOUNTER — Other Ambulatory Visit: Payer: Self-pay

## 2022-07-13 DIAGNOSIS — R0789 Other chest pain: Secondary | ICD-10-CM | POA: Diagnosis not present

## 2022-07-13 DIAGNOSIS — N9489 Other specified conditions associated with female genital organs and menstrual cycle: Secondary | ICD-10-CM | POA: Insufficient documentation

## 2022-07-13 DIAGNOSIS — R079 Chest pain, unspecified: Secondary | ICD-10-CM | POA: Diagnosis not present

## 2022-07-13 LAB — BASIC METABOLIC PANEL
Anion gap: 5 (ref 5–15)
BUN: 10 mg/dL (ref 6–20)
CO2: 27 mmol/L (ref 22–32)
Calcium: 9.4 mg/dL (ref 8.9–10.3)
Chloride: 109 mmol/L (ref 98–111)
Creatinine, Ser: 0.79 mg/dL (ref 0.44–1.00)
GFR, Estimated: >60 mL/min (ref >60)
Glucose, Bld: 80 mg/dL (ref 70–99)
Potassium: 3.6 mmol/L (ref 3.5–5.1)
Sodium: 141 mmol/L (ref 135–145)

## 2022-07-13 LAB — I-STAT BETA HCG BLOOD, ED (MC, WL, AP ONLY): I-stat hCG, quantitative: <5 m[IU]/mL (ref <5)

## 2022-07-13 LAB — CBC
HCT: 38.7 % (ref 36.0–46.0)
Hemoglobin: 12.7 g/dL (ref 12.0–15.0)
MCH: 30.1 pg (ref 26.0–34.0)
MCHC: 32.8 g/dL (ref 30.0–36.0)
MCV: 91.7 fL (ref 80.0–100.0)
Platelets: 277 10*3/uL (ref 150–400)
RBC: 4.22 MIL/uL (ref 3.87–5.11)
RDW: 12.8 % (ref 11.5–15.5)
WBC: 4.8 10*3/uL (ref 4.0–10.5)
nRBC: 0 % (ref 0.0–0.2)

## 2022-07-13 LAB — TROPONIN I (HIGH SENSITIVITY)
Troponin I (High Sensitivity): 67 ng/L — ABNORMAL HIGH (ref <18)
Troponin I (High Sensitivity): <2 ng/L (ref <18)
Troponin I (High Sensitivity): 2 ng/L (ref <18)

## 2022-07-13 MED ORDER — ACETAMINOPHEN 325 MG PO TABS
650.0000 mg | ORAL_TABLET | Freq: Once | ORAL | Status: AC
  Administered 2022-07-13: 650 mg via ORAL
  Filled 2022-07-13: qty 2

## 2022-07-13 MED ORDER — LIDOCAINE 5 % EX PTCH
1.0000 | MEDICATED_PATCH | CUTANEOUS | Status: DC
Start: 2022-07-13 — End: 2022-07-14
  Administered 2022-07-13: 1 via TRANSDERMAL
  Filled 2022-07-13: qty 1

## 2022-07-13 NOTE — ED Provider Notes (Signed)
[facility]-EMERGENCY DEPT Provider Note   CSN: [id number] Arrival date & time: 07/13/22  1730     History {Add pertinent medical, surgical, social history, OB history to HPI:1} Chief Complaint  Patient presents with   Chest Pain    Sharon Day is a 26 y.o. female with a hx of tubal ligation who presents to the ED with complaints of chest pain that began @ 1600 today. Pain is located to the central chest, non-radiating, sharp in nature, sometimes worse when she lays down, no alleviating factors. Tried ibuprofen without relief. Hx of similar pain w/ ED visit last month and had a similar episode between visits. Denies fever, chills, nausea, vomiting, diaphoresis, dyspnea,  leg pain/swelling, hemoptysis, recent surgery/trauma, recent long travel, hormone use, personal hx of cancer, or hx of DVT/PE. Pain is not worse with exertion or deep breathing.     HPI     Home Medications Prior to Admission medications   Medication Sig Start Date End Date Taking? Authorizing Provider  acetaminophen (TYLENOL) 500 MG tablet Take 500 mg by mouth every 6 (six) hours as needed for mild pain.     [provider]  metoCLOPramide (REGLAN) 5 MG tablet Take 1 tablet (5 mg total) by mouth every 8 (eight) hours as needed for nausea. 06/16/22   Sloan Leiter, DO  naproxen (NAPROSYN) 375 MG tablet Take 1 tablet twice daily as needed for chest pain. Patient not taking: Reported on 06/15/2022 05/26/22   Molpus, Jonny Ruiz, MD  ondansetron (ZOFRAN-ODT) 4 MG disintegrating tablet Take 1 tablet (4 mg total) by mouth every 8 (eight) hours as needed for nausea or vomiting. Patient not taking: Reported on 06/15/2022 03/11/22   Zenia Resides, MD  pantoprazole (PROTONIX) 40 MG tablet Take 1 tablet (40 mg total) by mouth daily. Patient not taking: Reported on 06/15/2022 03/11/22   Zenia Resides, MD  sucralfate (CARAFATE) 1 g tablet Take 1 tablet (1 g total) by mouth 2 (two) times daily. Patient  not taking: Reported on 06/15/2022 03/11/22   Zenia Resides, MD      Allergies    Patient has no known allergies.    Review of Systems   Review of Systems  Constitutional:  Negative for chills and fever.  Respiratory:  Negative for shortness of breath.   Cardiovascular:  Positive for chest pain. Negative for leg swelling.  Gastrointestinal:  Negative for abdominal pain, nausea and vomiting.  Genitourinary:  Positive for vaginal bleeding (currently menstruating).  Neurological:  Negative for syncope.  All other systems reviewed and are negative.   Physical Exam Updated Vital Signs BP 126/89 (BP Location: Right Arm)   Pulse 65   Temp 97.8 F (36.6 C) (Oral)   Resp 17   Ht 5\' 3"  (1.6 m)   Wt 44.5 kg   LMP 07/13/2022 (Approximate)   SpO2 95%   BMI 17.36 kg/m  Physical Exam Vitals and nursing note reviewed.  Constitutional:      General: She is not in acute distress.    Appearance: She is well-developed. She is not toxic-appearing.  HENT:     Head: Normocephalic and atraumatic.  Eyes:     General:        Right eye: No discharge.        Left eye: No discharge.     Conjunctiva/sclera: Conjunctivae normal.  Cardiovascular:     Rate and Rhythm: Normal rate and regular rhythm.  Pulmonary:     Effort: No respiratory  distress.     Breath sounds: Normal breath sounds. No wheezing or rales.  Chest:     Chest wall: Tenderness (anterior chest wall) present.  Abdominal:     General: There is no distension.     Palpations: Abdomen is soft.     Tenderness: There is no abdominal tenderness.  Musculoskeletal:     Cervical back: Neck supple.     Right lower leg: No tenderness. No edema.     Left lower leg: No tenderness. No edema.  Skin:    General: Skin is warm and dry.  Neurological:     Mental Status: She is alert.     Comments: Clear speech.   Psychiatric:        Behavior: Behavior normal.     ED Results / Procedures / Treatments   Labs (all labs ordered are  listed, but only abnormal results are displayed) Labs Reviewed  TROPONIN I (HIGH SENSITIVITY) - Abnormal; Notable for the following components:      Result Value   Troponin I (High Sensitivity) 67 (*)    All other components within normal limits  BASIC METABOLIC PANEL  CBC  I-STAT BETA HCG BLOOD, ED ([facility], [facility], [facility] ONLY)  TROPONIN I (HIGH SENSITIVITY)  TROPONIN I (HIGH SENSITIVITY)    EKG None  Radiology DG Chest 2 View  Result Date: 07/13/2022 CLINICAL DATA:  Chest pain and congestion x2 days. EXAM: CHEST - 2 VIEW COMPARISON:  Chest radiograph May 26, 2022. FINDINGS: The heart size and mediastinal contours are within normal limits. No focal consolidation. No pleural effusion. No pneumothorax. The visualized skeletal structures are unremarkable. IMPRESSION: No acute cardiopulmonary disease. Electronically Signed   By: Maudry Mayhew M.D.   On: 07/13/2022 18:12    Procedures Procedures  {Document cardiac monitor, telemetry assessment procedure when appropriate:1}  Medications Ordered in ED Medications  acetaminophen (TYLENOL) tablet 650 mg (has no administration in time range)  lidocaine (LIDODERM) 5 % 1 patch (has no administration in time range)    ED Course/ Medical Decision Making/ A&P                           Medical Decision Making Amount and/or Complexity of Data Reviewed Labs: ordered. Radiology: ordered.  Risk OTC drugs. Prescription drug management.   ***  {Document critical care time when appropriate:1} {Document review of labs and clinical decision tools ie heart score, Chads2Vasc2 etc:1}  {Document your independent review of radiology images, and any outside records:1} {Document your discussion with family members, caretakers, and with consultants:1} {Document social determinants of health affecting pt's care:1} {Document your decision making why or why not admission, treatments were needed:1} Final Clinical Impression(s) / ED Diagnoses Final diagnoses:   None    Rx / DC Orders ED Discharge Orders     None

## 2022-07-13 NOTE — ED Provider Triage Note (Signed)
Emergency Medicine Provider Triage Evaluation Note  Sharon Day , a 26 y.o. female  was evaluated in triage.  Pt complains of sharp pain in the mid chest.  She has had this pain before.  It comes on spontaneously and last for about 30 minutes.  No associated fevers, cough, shortness of breath.  She took a medication that was she was prescribed in the past, something "like a strong ibuprofen", but did not really help.  No shortness of breath or leg swelling.  Review of Systems  Positive: Chest pain Negative: Fever, cough  Physical Exam  BP 121/79 (BP Location: Left Arm)   Pulse 64   Temp 97.6 F (36.4 C) (Oral)   Resp 20   LMP 06/16/2022 (Approximate)   SpO2 100%  Gen:   Awake, no distress   Resp:  Normal effort  MSK:   Moves extremities without difficulty  Other:  Heart normal rate, regular rhythm, no murmurs; lungs clear to auscultation bilaterally  Medical Decision Making  Medically screening exam initiated at 5:52 PM.  Appropriate orders placed.  [name] was informed that the remainder of the evaluation will be completed by another provider, this initial triage assessment does not replace that evaluation, and the importance of remaining in the ED until their evaluation is complete.     Renne Crigler, PA-C 07/13/22 6734

## 2022-07-13 NOTE — ED Notes (Signed)
Save blue tube in main lab °

## 2022-07-13 NOTE — ED Triage Notes (Signed)
Pt reports central chest pain that is non radiating. Pt reports she took "chest medicine" that she is unaware the name of that did not help her.

## 2022-07-14 MED ORDER — LIDOCAINE 5 % EX PTCH: 1.0000 | MEDICATED_PATCH | Freq: Every day | CUTANEOUS | 0 refills | Status: DC | PRN

## 2022-07-14 MED ORDER — METHOCARBAMOL 500 MG PO TABS
500.0000 mg | ORAL_TABLET | Freq: Three times a day (TID) | ORAL | 0 refills | Status: DC | PRN
Start: 2022-07-14 — End: 2022-12-12

## 2022-07-14 MED ORDER — NAPROXEN 500 MG PO TABS: 500.0000 mg | ORAL_TABLET | Freq: Two times a day (BID) | ORAL | 0 refills | Status: DC | PRN

## 2022-07-14 NOTE — Discharge Instructions (Signed)
You were seen in the emergency department today for chest pain. Your work-up in the emergency department has been overall reassuring. Your labs have been fairly normal and or similar to previous blood work you have had done. Your EKG and the enzyme we use to check your heart did not show an acute heart attack at this time. Your chest x-ray was normal.   We're sending you home with the following medications as this time:   - Naproxen- this is a nonsteroidal anti-inflammatory medication that will help with pain and swelling. Be sure to take this medication as prescribed with food, 1 pill every 12 hours,  It should be taken with food, as it can cause stomach upset, and more seriously, stomach bleeding. Do not take other nonsteroidal anti-inflammatory medications with this such as Advil, Motrin, Aleve, Mobic, Goodie Powder, or Motrin etc..    - Robaxin- this is the muscle relaxer I have prescribed, this is meant to help with muscle tightness/spasms. Be aware that this medication may make you drowsy therefore the first time you take this it should be at a time you are in an environment where you can rest. Do not drive or operate heavy machinery when taking this medication. Do not drink alcohol or take other sedating medications with this medicine such as narcotics or benzodiazepines.   - Lidoderm patch- Apply 1 patch to your area of most significant pain once per day to help numb/soothe this area. Remove & discard patch within 12 hours of application.   You make take Tylenol per over the counter dosing with these medications.   We have prescribed you new medication(s) today. Discuss the medications prescribed today with your pharmacist as they can have adverse effects and interactions with your other medicines including over the counter and prescribed medications. Seek medical evaluation if you start to experience new or abnormal symptoms after taking one of these medicines, seek care immediately if you start  to experience difficulty breathing, feeling of your throat closing, facial swelling, or rash as these could be indications of a more serious allergic reaction   We would like you to follow up closely with your primary care provider  within 1-3 days.  If you do not have a primary care provider please see the attached phone number for assistance with this.  Return to the ER immediately should you experience any new or worsening symptoms including but not limited to return of pain, worsened pain, vomiting, shortness of breath, dizziness, lightheadedness, passing out, or any other concerns that you may have.

## 2022-07-15 ENCOUNTER — Telehealth: Payer: Self-pay | Admitting: *Deleted

## 2022-07-15 NOTE — Patient Outreach (Signed)
Care Coordination  07/15/2022  Sharon Day 1996/09/14 [id number]   Transition Care Management Follow-up Telephone Call Date of discharge and from where: 07/13/22 from [facility] How have you been since you were released from the hospital? "I'm still having chest pain" Any questions or concerns? No, RNCM discussed frequent ED visits and advised patient to establish care with a PCP close to home  Items Reviewed: Did the pt receive and understand the discharge instructions provided? Yes  Medications obtained and verified? Yes , Prescribed medications decrease the pain Other? No  Any new allergies since your discharge? No  Dietary orders reviewed? Yes Do you have support at home? Yes   Home Care and Equipment/Supplies: Were home health services ordered? no If so, what is the name of the agency? N/A  Has the agency set up a time to come to the patient's home? not applicable Were any new equipment or medical supplies ordered?  No What is the name of the medical supply agency? N/A Were you able to get the supplies/equipment? not applicable Do you have any questions related to the use of the equipment or supplies? No  Functional Questionnaire: (I = Independent and D = Dependent) ADLs: I  Bathing/Dressing- I  Meal Prep- I  Eating- I  Maintaining continence- I  Transferring/Ambulation- I  Managing Meds- I  Follow up appointments reviewed:  PCP Hospital f/u appt confirmed? Yes  Scheduled to see CHW on 07/16/22 @ 9:10am. Specialist Hospital f/u appt confirmed?  N/A   Are transportation arrangements needed? No  If their condition worsens, is the pt aware to call PCP or go to the Emergency Dept.? Yes Was the patient provided with contact information for the PCP's office or ED? Yes Was to pt encouraged to call back with questions or concerns? Yes  Patient is interested in CM services and agreed to an initial telephone outreach on 07/23/22 @ 2:30pm.  Estanislado Emms RN, BSN [facility]  [facility] RN Care Coordinator

## 2022-07-16 ENCOUNTER — Inpatient Hospital Stay: Payer: Medicaid Other | Admitting: Nurse Practitioner

## 2022-07-23 ENCOUNTER — Other Ambulatory Visit: Payer: Self-pay | Admitting: *Deleted

## 2022-07-23 NOTE — Patient Instructions (Signed)
Visit Information  Ms. Sharon Day  - as a part of your Medicaid benefit, you are eligible for care management and care coordination services at no cost or copay. I was unable to reach you by phone today but would be happy to help you with your health related needs. Please feel free to call me @ [phone].   A member of the Managed Medicaid care management team will reach out to you again over the next 14 days.   Lurena Joiner RN, BSN [place]  [employer] manager

## 2022-07-23 NOTE — Patient Outreach (Signed)
  Medicaid Managed Care   Unsuccessful Attempt Note   07/23/2022 Name: Sharon Day MRN: [id number] DOB: 02-09-1996  Referred by: [facility] Reason for referral : High Risk Managed Medicaid (Unsuccessful RNCM follow up telephone outreach)   An unsuccessful telephone outreach was attempted today. The patient was referred to the case management team for assistance with care management and care coordination.    Follow Up Plan: A HIPAA compliant phone message was left for the patient providing contact information and requesting a return call.    Lurena Joiner RN, BSN [place]  [employer] manager

## 2022-08-07 ENCOUNTER — Other Ambulatory Visit: Payer: Self-pay | Admitting: *Deleted

## 2022-08-07 NOTE — Patient Instructions (Signed)
Visit Information  Ms. Cowens was given information about Medicaid Managed Care team care coordination services as a part of their [place] Medicaid benefit. [name] verbally consented to engagement with the [facility] Managed Care team.   If you are experiencing a medical emergency, please call 911 or report to your local emergency department or urgent care.   If you have a non-emergency medical problem during routine business hours, please contact your provider's office and ask to speak with a nurse.   For questions related to your [facility], please call: [phone] or visit the homepage here: [email or web address]  If you would like to schedule transportation through your [facility], please call the following number at least 2 days in advance of your appointment: [phone]   Rides for urgent appointments can also be made after hours by calling Member Services.  Call the [place] at [phone], at any time, 24 hours a day, 7 days a week. If you are in danger or need immediate medical attention call 911.  If you would like help to quit smoking, call 1-800-QUIT-NOW 773-441-3639) OR Espaol: 1-855-Djelo-Ya (8-101-751-0258) o para ms informacin haga clic aqu or Text READY to 200-400 to register via text  Ms. Corinna Capra,   Please see education materials related to gastritis provided by MyChart link.  Patient verbalizes understanding of instructions and care plan provided today and agrees to view in [place]. Active MyChart status and patient understanding of how to access instructions and care plan via MyChart confirmed with patient.     Telephone follow up appointment with Managed Medicaid care management team member scheduled for:10/07/22 @ 3:45pm  Lurena Joiner RN, BSN [place] RN Care  Coordinator   Following is a copy of your plan of care:  Care Plan : RN Care Manager Plan of Care  Updates made by Melissa Montane, RN since 08/07/2022 12:00 AM     Problem: Health Management needs related to Gastritis      Long-Range Goal: Development of Plan of Care to address Health Management needs related to Gastritis   Start Date: 08/07/2022  Expected End Date: 11/05/2022  Priority: High  Note:   Current Barriers:  Knowledge Deficits related to plan of care for management of gastritis  Ms. Berghuis has multiple ED visits in the last 6 months. She has not established care with a PCP since living in [place] over the last 3 years. She was unaware of hospital follow up on 08/14/22 and would like to establish care during that visit. She expresses concern about her intolerance to certain foods and the amount of food. She feels that her toddler can eat more than she does.    RNCM Clinical Goal(s):  Patient will verbalize understanding of plan for management of Gastritis as evidenced by patient reports attend all scheduled medical appointments: 08/14/22 with Dr. Wynetta Emery as evidenced by provider documentation in EMR          Interventions: Inter-disciplinary care team collaboration (see longitudinal plan of care) Evaluation of current treatment plan related to  self management and patient's adherence to plan as established by provider   Gastritis  (Status: New goal.) Long Term Goal  Evaluation of current treatment plan related to  Gastritis ,  self-management and patient's adherence to plan as established by provider. Discussed plans with patient for ongoing care management follow up and provided patient with direct contact information for care management team  Advised patient to discuss any concerns or questions with PCP during visit on 08/14/22; Reviewed medications with patient and discussed taking as needed; Provided patient with MyChart educational materials related to  gastritis; Reviewed scheduled/upcoming provider appointments including 08/14/22 with Dr. Laural Benes; Social Work referral for dental provider, scheduled on 08/15/22 @ 3pm; Discussed plans with patient for ongoing care management follow up and provided patient with direct contact information for care management team; Discussed foods to avoid, advised patient to start a journal and write down foods that upset her stomach Discussed the importance of having a PCP   Patient Goals/Self-Care Activities: Take medications as prescribed   Attend all scheduled provider appointments Call provider office for new concerns or questions  Work with the social worker to address care coordination needs and will continue to work with the clinical team to address health care and disease management related needs

## 2022-08-07 NOTE — Patient Outreach (Signed)
Medicaid Managed Care   Nurse Care Manager Note  08/07/2022 Name:  Sharon Day MRN:  [id number] DOB:  Apr 17, 1996  Sharon Day is an 26 y.o. year old female who is a primary patient of [facility] team was consulted for assistance with:    Gastritis  Sharon Day was given information about Medicaid Managed Care Coordination team services today. Sharon Day Patient agreed to services and verbal consent obtained.  Engaged with patient by telephone for initial visit in response to provider referral for case management and/or care coordination services.   Assessments/Interventions:  Review of past medical history, allergies, medications, health status, including review of consultants reports, laboratory and other test data, was performed as part of comprehensive evaluation and provision of chronic care management services.  SDOH (Social Determinants of Health) assessments and interventions performed: SDOH Interventions    Flowsheet Row Patient Outreach Telephone from 08/07/2022 in [place] Coordination  SDOH Interventions   Housing Interventions Intervention Not Indicated  Transportation Interventions Intervention Not Indicated       Care Plan  No Known Allergies  Medications Reviewed Today     Reviewed by Melissa Montane, RN (Registered Nurse) on 08/07/22 at 1518  Med List Status: <None>   Medication Order Taking? Sig Documenting Provider Last Dose Status Informant  acetaminophen (TYLENOL) 500 MG tablet [id number] Yes Take 500 mg by mouth every 6 (six) hours as needed for mild pain.  [provider] Taking Active Self, Pharmacy Records  lidocaine (LIDODERM) 5 % [id number] No Place 1 patch onto the skin daily as needed. Apply patch to area most significant pain once per day.  Remove and discard patch within 12 hours of application.  Patient not taking: Reported on 08/07/2022   Amaryllis Dyke, PA-C Not Taking Active   methocarbamol (ROBAXIN) 500 MG tablet [id number] Yes Take 1 tablet (500 mg total) by mouth every 8 (eight) hours as needed for muscle spasms. Petrucelli, Sharon Jaeger, PA-C Taking Active   metoCLOPramide (REGLAN) 5 MG tablet [id number] Yes Take 1 tablet (5 mg total) by mouth every 8 (eight) hours as needed for nausea. Sharon Sparrow, DO Taking Active   Multiple Vitamin (MULTIVITAMIN) tablet [id number] Yes Take 1 tablet by mouth daily. [provider] Taking Active   naproxen (NAPROSYN) 500 MG tablet [id number] Yes Take 1 tablet (500 mg total) by mouth 2 (two) times daily as needed for moderate pain. Petrucelli, Sharon Jaeger, PA-C Taking Active             There are no problems to display for this patient.   Conditions to be addressed/monitored per PCP order:   gastritis  Care Plan : [place] of Care  Updates made by Melissa Montane, RN since 08/07/2022 12:00 AM     Problem: Health Management needs related to Gastritis      Long-Range Goal: Development of Plan of Care to address Health Management needs related to Gastritis   Start Date: 08/07/2022  Expected End Date: 11/05/2022  Priority: High  Note:   Current Barriers:  Knowledge Deficits related to plan of care for management of gastritis  Sharon Day has multiple ED visits in the last 6 months. She has not established care with a PCP since living in [place] over the last 3 years. She was unaware of hospital follow up on 08/14/22 and would like to establish care during that visit. She expresses concern about her  intolerance to certain foods and the amount of food. She feels that her toddler can eat more than she does.    RNCM Clinical Goal(s):  Patient will verbalize understanding of plan for management of Gastritis as evidenced by patient reports attend all scheduled medical appointments: 08/14/22 with Dr. Laural Benes as evidenced by provider documentation in EMR           Interventions: Inter-disciplinary care team collaboration (see longitudinal plan of care) Evaluation of current treatment plan related to  self management and patient's adherence to plan as established by provider   Gastritis  (Status: New goal.) Long Term Goal  Evaluation of current treatment plan related to  Gastritis ,  self-management and patient's adherence to plan as established by provider. Discussed plans with patient for ongoing care management follow up and provided patient with direct contact information for care management team Advised patient to discuss any concerns or questions with PCP during visit on 08/14/22; Reviewed medications with patient and discussed taking as needed; Provided patient with MyChart educational materials related to gastritis; Reviewed scheduled/upcoming provider appointments including 08/14/22 with Dr. Laural Benes; Social Work referral for dental provider, scheduled on 08/15/22 @ 3pm; Discussed plans with patient for ongoing care management follow up and provided patient with direct contact information for care management team; Discussed foods to avoid, advised patient to start a journal and write down foods that upset her stomach Discussed the importance of having a PCP   Patient Goals/Self-Care Activities: Take medications as prescribed   Attend all scheduled provider appointments Call provider office for new concerns or questions  Work with the social worker to address care coordination needs and will continue to work with the clinical team to address health care and disease management related needs       Follow Up:  Patient agrees to Care Plan and Follow-up.  Plan: The Managed Medicaid care management team will reach out to the patient again over the next 60 days.  Date/time of next scheduled RN care management/care coordination outreach:  10/07/22 @ 3:45pm  Sharon Emms RN, BSN [place]  [employer]

## 2022-08-12 ENCOUNTER — Inpatient Hospital Stay: Payer: Medicaid Other | Admitting: Nurse Practitioner

## 2022-08-14 ENCOUNTER — Encounter: Payer: Self-pay | Admitting: Internal Medicine

## 2022-08-14 ENCOUNTER — Ambulatory Visit: Payer: Medicaid Other | Attending: Nurse Practitioner | Admitting: Internal Medicine

## 2022-08-14 ENCOUNTER — Other Ambulatory Visit: Payer: Self-pay

## 2022-08-14 VITALS — BP 106/68 | HR 61 | Ht 63.0 in | Wt 104.2 lb

## 2022-08-14 DIAGNOSIS — Z7689 Persons encountering health services in other specified circumstances: Secondary | ICD-10-CM | POA: Diagnosis not present

## 2022-08-14 DIAGNOSIS — R079 Chest pain, unspecified: Secondary | ICD-10-CM

## 2022-08-14 DIAGNOSIS — L708 Other acne: Secondary | ICD-10-CM | POA: Diagnosis not present

## 2022-08-14 DIAGNOSIS — R1010 Upper abdominal pain, unspecified: Secondary | ICD-10-CM | POA: Diagnosis not present

## 2022-08-14 DIAGNOSIS — Z2821 Immunization not carried out because of patient refusal: Secondary | ICD-10-CM | POA: Diagnosis not present

## 2022-08-14 DIAGNOSIS — R634 Abnormal weight loss: Secondary | ICD-10-CM

## 2022-08-14 MED ORDER — ADAPALENE 0.1 % EX CREA: TOPICAL_CREAM | CUTANEOUS | 0 refills | Status: DC

## 2022-08-14 MED ORDER — PANTOPRAZOLE SODIUM 40 MG PO TBEC: 40.0000 mg | DELAYED_RELEASE_TABLET | Freq: Every day | ORAL | 3 refills | Status: DC

## 2022-08-14 MED ORDER — CLINDAMYCIN PHOS-BENZOYL PEROX 1-5 % EX GEL: CUTANEOUS | 0 refills | Status: DC

## 2022-08-14 NOTE — Progress Notes (Signed)
Patient ID: Sharon Day, female    DOB: [birth year]  MRN: [id number]  CC: Hospitalization Follow-up   Subjective: Sharon Day is a 26 y.o. female who presents for new pt visit Her concerns today include:   Previous PCP was [facility] in [place].  Lived in [place] for 3 yrs. No chronic med issues  She is requesting referral to dermatology. Face breaks out intermittently x a few yrs Uses Dove Soap, Boii oil; clears for a while then recurs Neutragena and other products OTC do not work.   Complains of intermittent pain in the stomach for the past several months.  She points to the upper and mid abdomen.  It is associated with nausea and sometimes vomiting.  Seen at urgent care earlier this year for the same and was prescribed pantoprazole.  States she was told to avoid spicy foods, fried foods and sodas which she has done.  She has been out of pantoprazole.  Lately she has been experiencing early satiety.  She has lost 13 pounds since July of this year.  Denies any problems swallowing.  Moving bowels okay.  No blood in the stools.  Also complains of intermittent chest pains which can occur on either side of the chest and sometimes in the middle of the chest.  Pain described as sharp and stabbing and can last up to an hour.  Can come on at rest or with exertion.  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  The pain does not radiate.  No shortness of breath.  No family history of heart disease.  She does not smoke.  When she gets chest pains, she has to leave her job and sometimes she goes straight to the emergency room.  She is thinking about applying for FMLA until things are sorted out. Endorses feeling overwhelmed at times mainly in stressful situations. Seen in the ER 07/13/2022 for chest pain.  Work-up including EKG, cardiac enzymes and chest x-ray negative.  Thought to be musculoskeletal in nature. Prescribed lidocaine patch, Robaxin and  Naprosyn.  She felt the lidocaine patch was helping when she placed it on the center of her chest between the breasts but it caused her to broke out in a rash so she had to discontinue using it.     Current Outpatient Medications on File Prior to Visit  Medication Sig Dispense Refill   acetaminophen (TYLENOL) 500 MG tablet Take 500 mg by mouth every 6 (six) hours as needed for mild pain.      methocarbamol (ROBAXIN) 500 MG tablet Take 1 tablet (500 mg total) by mouth every 8 (eight) hours as needed for muscle spasms. 15 tablet 0   metoCLOPramide (REGLAN) 5 MG tablet Take 1 tablet (5 mg total) by mouth every 8 (eight) hours as needed for nausea. 10 tablet 0   Multiple Vitamin (MULTIVITAMIN) tablet Take 1 tablet by mouth daily.     No current facility-administered medications on file prior to visit.    No Known Allergies  Social History   Socioeconomic History   Marital status: Significant Other    Spouse name: Not on file   Number of children: Not on file   Years of education: Not on file   Highest education level: Not on file  Occupational History   Not on file  Tobacco Use   Smoking status: Never   Smokeless tobacco: Never  Substance and Sexual Activity   Alcohol  use: Not Currently   Drug use: Yes    Types: Marijuana   Sexual activity: Yes  Other Topics Concern   Not on file  Social History Narrative   Not on file   Social Determinants of Health   Financial Resource Strain: Not on file  Food Insecurity: Not on file  Transportation Needs: No Transportation Needs (08/07/2022)   PRAPARE - Administrator, Civil Service (Medical): No    Lack of Transportation (Non-Medical): No  Physical Activity: Not on file  Stress: Not on file  Social Connections: Not on file  Intimate Partner Violence: Not on file    Family History  Family history unknown: Yes    Past Surgical History:  Procedure Laterality Date   TUBAL LIGATION Bilateral 2020    ROS: Review of  Systems Negative except as stated above  PHYSICAL EXAM: BP 106/68   Pulse 61   Ht 5\' 3"  (1.6 m)   Wt 104 lb 3.2 oz (47.3 kg)   LMP 08/12/2022   SpO2 100%   BMI 18.46 kg/m   Wt Readings from Last 3 Encounters:  08/14/22 104 lb 3.2 oz (47.3 kg)  07/13/22 98 lb (44.5 kg)  06/28/22 98 lb (44.5 kg)    Physical Exam  General appearance - alert, well appearing, young African-American female and in no distress Mental status - normal mood, behavior, speech, dress, motor activity, and thought processes Chest - clear to auscultation, no wheezes, rales or rhonchi, symmetric air entry Heart - normal rate, regular rhythm, normal S1, S2, no murmurs, rubs, clicks or gallops.  No reproducible tenderness on palpation of the chest wall. Abdomen - soft, nontender, nondistended, no masses or organomegaly Extremities - peripheral pulses normal, no pedal edema, no clubbing or cyanosis Skin -she has moderate acne on her face most with blackheads but also some whiteheads.   generalized anxiety disorder    08/14/2022    9:37 AM  GAD 7 : Generalized Anxiety Score  Nervous, Anxious, on Edge 0  Control/stop worrying 0  Worry too much - different things 0  Trouble relaxing 0  Restless 0  Easily annoyed or irritable 0  Afraid - awful might happen 0  Total GAD 7 Score 0        08/14/2022    9:37 AM  Depression screen PHQ 2/9  Decreased Interest 0  Down, Depressed, Hopeless 0  PHQ - 2 Score 0  Altered sleeping 0  Tired, decreased energy 0  Change in appetite 0  Feeling bad or failure about yourself  0  Trouble concentrating 0  Moving slowly or fidgety/restless 0  Suicidal thoughts 0  PHQ-9 Score 0       Latest Ref Rng & Units 07/13/2022    6:30 PM 06/16/2022    1:50 AM 11/07/2021    1:39 PM  CMP  Glucose 70 - 99 mg/dL 80  85  93   BUN 6 - 20 mg/dL 10  11  7    Creatinine 0.44 - 1.00 mg/dL 01/05/2022   4.66   Sodium 135 - 145 mmol/L 141  141  139   Potassium 3.5 - 5.1 mmol/L 3.6  3.7   4.3   Chloride 98 - 111 mmol/L 109  111  105   CO2 22 - 32 mmol/L 27  25  26    Calcium 8.9 - 10.3 mg/dL 9.4  8.2  9.1   Total Protein 6.5 - 8.1 g/dL   7.0   Total Bilirubin  0.3 - 1.2 mg/dL   0.6   Alkaline Phos 38 - 126 U/L   40   AST 15 - 41 U/L   35   ALT 0 - 44 U/L   28    Lipid Panel  No results found for: "CHOL", "TRIG", "HDL", "CHOLHDL", "VLDL", "LDLCALC", "LDLDIRECT"  CBC    Component Value Date/Time   WBC 4.8 07/13/2022 1830   RBC 4.22 07/13/2022 1830   HGB 12.7 07/13/2022 1830   HCT 38.7 07/13/2022 1830   PLT 277 07/13/2022 1830   MCV 91.7 07/13/2022 1830   MCH 30.1 07/13/2022 1830   MCHC 32.8 07/13/2022 1830   RDW 12.8 07/13/2022 1830   LYMPHSABS 2.6 06/15/2022 2340   MONOABS 0.4 06/15/2022 2340   EOSABS 0.1 06/15/2022 2340   BASOSABS 0.0 06/15/2022 2340    ASSESSMENT AND PLAN:  1. Establishing care with new doctor, encounter for   2. Other acne We will prescribe some Differin gel.  Advised to use it only twice a week as it can cause drying of the skin.  We will also prescribe combination of clindamycin and benzyl peroxide gel.  Advised to stop the oil that she is putting on her face. - adapalene (DIFFERIN) 0.1 % cream; Apply to the face at bedtime twice a week  Dispense: 45 g; Refill: 0 - clindamycin-benzoyl peroxide (BENZACLIN) gel; Apply to the face twice daily 3 times a week.  Dispense: 25 g; Refill: 0 - Ambulatory referral to Dermatology  3. Pain of upper abdomen Differential diagnoses include gallbladder disease, GERD, peptic ulcer disease.  Stop Naprosyn.  GERD precautions given.  Restart pantoprazole.  Refer to gastroenterology especially given the history of early satiety with weight loss over the past several months. - Hepatic Function Panel - Lipase - US Abdomen Limited RUQ (LIVER/GB); Future - Ambulatory referral to Gastroenterology - pantoprazole (PROTONIX) 40 MG tablet; Take 1 tablet (40 mg total) by mouth daily.  Dispense: 30 tablet; Refill:  3  4. Weight loss, unintentional - TSH+T4F+T3Free - Ambulatory referral to Gastroenterology  5. Chest pain in adult - Ambulatory referral to Cardiology  6. Influenza vaccination declined   Patient was given the opportunity to ask questions.  Patient verbalized understanding of the plan and was able to repeat key elements of the plan.   This documentation was completed using Radio producer.  Any transcriptional errors are unintentional.  Orders Placed This Encounter  Procedures   US Abdomen Limited RUQ (LIVER/GB)   Hepatic Function Panel   Lipase   TSH+T4F+T3Free   Ambulatory referral to Gastroenterology   Ambulatory referral to Cardiology   Ambulatory referral to Dermatology     Requested Prescriptions   Signed Prescriptions Disp Refills   adapalene (DIFFERIN) 0.1 % cream 45 g 0    Sig: Apply to the face at bedtime twice a week   clindamycin-benzoyl peroxide (BENZACLIN) gel 25 g 0    Sig: Apply to the face twice daily 3 times a week.   pantoprazole (PROTONIX) 40 MG tablet 30 tablet 3    Sig: Take 1 tablet (40 mg total) by mouth daily.    Return if symptoms worsen or fail to improve.  Karle Plumber, MD, FACP

## 2022-08-14 NOTE — Patient Instructions (Signed)
Stop lidocaine patches since it caused your skin to break out.  Stop Naprosyn.  Purchase Voltaren gel over-the-counter and use it as needed on the chest.

## 2022-08-14 NOTE — Progress Notes (Signed)
Pt has concerns per on & off chest pain  Pt also wants to discuss GI concerns.   Pt also requesting referral for Dermatology

## 2022-08-15 ENCOUNTER — Other Ambulatory Visit: Payer: Self-pay

## 2022-08-15 LAB — HEPATIC FUNCTION PANEL
ALT: 10 IU/L (ref 0–32)
AST: 14 IU/L (ref 0–40)
Albumin: 4.7 g/dL (ref 4.0–5.0)
Alkaline Phosphatase: 49 IU/L (ref 44–121)
Bilirubin Total: 0.6 mg/dL (ref 0.0–1.2)
Bilirubin, Direct: 0.17 mg/dL (ref 0.00–0.40)
Total Protein: 7 g/dL (ref 6.0–8.5)

## 2022-08-15 LAB — TSH+T4F+T3FREE
Free T4: 1.18 ng/dL (ref 0.82–1.77)
T3, Free: 3.2 pg/mL (ref 2.0–4.4)
TSH: 0.56 u[IU]/mL (ref 0.450–4.500)

## 2022-08-15 LAB — LIPASE: Lipase: 29 U/L (ref 14–72)

## 2022-08-15 NOTE — Patient Instructions (Signed)
Visit Information  Sharon Day was given information about Medicaid Managed Care team care coordination services as a part of their [place] Medicaid benefit. Sharon Day verbally consented to engagement with the [facility] Managed Care team.   If you are experiencing a medical emergency, please call 911 or report to your local emergency department or urgent care.   If you have a non-emergency medical problem during routine business hours, please contact your provider's office and ask to speak with a nurse.   For questions related to your [facility], please call: [phone] or visit the homepage here: [email or web address]  If you would like to schedule transportation through your [facility], please call the following number at least 2 days in advance of your appointment: [phone]   Rides for urgent appointments can also be made after hours by calling Member Services.  Call the [facility] at [phone], at any time, 24 hours a day, 7 days a week. If you are in danger or need immediate medical attention call 911.  If you would like help to quit smoking, call 1-800-QUIT-NOW 479-612-4649) OR Espaol: 1-855-Djelo-Ya (8-144-818-5631) o para ms informacin haga clic aqu or Text READY to 200-400 to register via text  Sharon Day - following are the goals we discussed in your visit today:   Goals Addressed   None      Social Worker will contact patient next Friday 08/22/22 at [place], [place], [place] Medicaid Team  [phone]   Following is a copy of your plan of care:  There are no care plans that you recently modified to display for this patient.

## 2022-08-15 NOTE — Patient Outreach (Signed)
Care Coordination  08/15/2022  Roschelle Calandra Aug 31, 1996 [id number]   BSW completed telephone call with patient. She stated she is at the hospital with her childs father due to him being in a bad accident. She states she is very tired and would like to reschedule appointment to next Friday.     Mickel Fuchs, BSW, [place] Managed Medicaid Team  [phone]

## 2022-08-16 ENCOUNTER — Other Ambulatory Visit: Payer: Self-pay | Admitting: Internal Medicine

## 2022-08-16 MED ORDER — CLINDAMYCIN PHOSPHATE 1 % EX GEL: Freq: Two times a day (BID) | CUTANEOUS | 0 refills | Status: DC

## 2022-08-18 ENCOUNTER — Telehealth: Payer: Self-pay

## 2022-08-18 ENCOUNTER — Telehealth: Payer: Self-pay | Admitting: Internal Medicine

## 2022-08-18 MED ORDER — CLINDAMYCIN PHOSPHATE 1 % EX SOLN: Freq: Two times a day (BID) | CUTANEOUS | 0 refills | Status: DC

## 2022-08-18 NOTE — Telephone Encounter (Signed)
Rx sent for Cleocin-T solution.

## 2022-08-18 NOTE — Telephone Encounter (Signed)
Clindamycin phosphate gel not covered.  Covered alternatives:

## 2022-08-18 NOTE — Telephone Encounter (Signed)
Brandy calling from [place] asking about carnification for the UC ordered. Order was placed with upper abdominal pain as dx. [place] limited right upper quadrant.  Clarification is pain in right upper quadrant or does the exam need to be changed to abdomin complete?  Appt has not been scheduled. Needing clarification before scheduled.   CB- [phone]

## 2022-08-20 NOTE — Telephone Encounter (Signed)
Information from Dr Durenda Age response on clarification of UC given to radiologist.

## 2022-08-22 ENCOUNTER — Other Ambulatory Visit: Payer: Self-pay

## 2022-08-22 NOTE — Patient Instructions (Signed)
Visit Information  Sharon Day was given information about Medicaid Managed Care team care coordination services as a part of their [facility] Medicaid benefit. Sharon Day verbally consented to engagement with the [facility] Managed Care team.   If you are experiencing a medical emergency, please call 911 or report to your local emergency department or urgent care.   If you have a non-emergency medical problem during routine business hours, please contact your provider's office and ask to speak with a nurse.   For questions related to your [facility], please call: [phone] or visit the homepage here: [email or web address]  If you would like to schedule transportation through your [facility], please call the following number at least 2 days in advance of your appointment: [phone]   Rides for urgent appointments can also be made after hours by calling Member Services.  Call the Behavioral Health Crisis Line at [phone], at any time, 24 hours a day, 7 days a week. If you are in danger or need immediate medical attention call 911.  If you would like help to quit smoking, call 1-800-QUIT-NOW ((419)136-4215) OR Espaol: 1-855-Djelo-Ya (3-710-626-9485) o para ms informacin haga clic aqu or Text READY to 462-703 to register via text  Ms. Rana Snare - following are the goals we discussed in your visit today:   Goals Addressed   None      Social Worker will follow up in 30 days .   Gus Puma, BSW, [facility]  [place]  High Risk Managed Medicaid Team  [phone]   Following is a copy of your plan of care:  Care Plan : RN Care Manager Plan of Care  Updates made by Shaune Leeks since 08/22/2022 12:00 AM     Problem: Health Management needs related to Gastritis      Long-Range Goal: Development of Plan of Care to address  Health Management needs related to Gastritis   Start Date: 08/07/2022  Expected End Date: 11/05/2022  Priority: High  Note:   Current Barriers:  Knowledge Deficits related to plan of care for management of gastritis  Sharon Day has multiple ED visits in the last 6 months. She has not established care with a PCP since living in [place] over the last 3 years. She was unaware of hospital follow up on 08/14/22 and would like to establish care during that visit. She expresses concern about her intolerance to certain foods and the amount of food. She feels that her toddler can eat more than she does.    RNCM Clinical Goal(s):  Patient will verbalize understanding of plan for management of Gastritis as evidenced by patient reports attend all scheduled medical appointments: 08/14/22 with Dr. Laural Benes as evidenced by provider documentation in EMR          Interventions: Inter-disciplinary care team collaboration (see longitudinal plan of care) Evaluation of current treatment plan related to  self management and patient's adherence to plan as established by provider BSW completed a telephone outreach with patient for dental resources. Patient stated she just moved to [place] from [place]. BSW completed SDOH assessment and no other resources are needed at this time.   Gastritis  (Status: New goal.) Long Term Goal  Evaluation of current treatment plan related to  Gastritis ,  self-management and patient's adherence to plan as established by provider. Discussed plans with patient for ongoing care management follow up and provided patient with direct contact information for care  management team Advised patient to discuss any concerns or questions with PCP during visit on 08/14/22; Reviewed medications with patient and discussed taking as needed; Provided patient with MyChart educational materials related to gastritis; Reviewed scheduled/upcoming provider appointments including 08/14/22 with Dr.  Wynetta Emery; Social Work referral for dental provider, scheduled on 08/15/22 @ 3pm; Discussed plans with patient for ongoing care management follow up and provided patient with direct contact information for care management team; Discussed foods to avoid, advised patient to start a journal and write down foods that upset her stomach Discussed the importance of having a PCP   Patient Goals/Self-Care Activities: Take medications as prescribed   Attend all scheduled provider appointments Call provider office for new concerns or questions  Work with the social worker to address care coordination needs and will continue to work with the clinical team to address health care and disease management related needs

## 2022-08-22 NOTE — Patient Outreach (Signed)
Medicaid Managed Care Social Work Note  08/22/2022 Name:  Sharon Day MRN:  [id number] DOB:  12-12-1995  Sharon Day is an 26 y.o. year old female who is a primary patient of [place] Coordination team was consulted for assistance with:   dental resources  Ms. Becklund was given information about Medicaid Managed Care Coordination team services today. Sharon Day Patient agreed to services and verbal consent obtained.  Engaged with patient  for by telephone forinitial visit in response to referral for case management and/or care coordination services.   Assessments/Interventions:  Review of past medical history, allergies, medications, health status, including review of consultants reports, laboratory and other test data, was performed as part of comprehensive evaluation and provision of chronic care management services.  SDOH: (Social Determinant of Health) assessments and interventions performed: SDOH Interventions    Flowsheet Row Patient Outreach Telephone from 08/07/2022 in [place] Coordination  SDOH Interventions   Housing Interventions Intervention Not Indicated  Transportation Interventions Intervention Not Indicated     SW completed a telephone outreach with patient for dental resources. Patient stated she just moved to [place] from [place] completed SDOH assessment and no other resources are needed at this time. BSW sent resources via email to [email or web address]  Advanced Directives Status:  Not addressed in this encounter.  Care Plan                 No Known Allergies  Medications Reviewed Today     Reviewed by Melissa Montane, RN (Registered Nurse) on 08/07/22 at 1518  Med List Status: <None>   Medication Order Taking? Sig Documenting Provider Last Dose Status Informant  acetaminophen (TYLENOL) 500 MG tablet [id number] Yes Take 500 mg by mouth every 6 (six) hours as needed for  mild pain.  [provider] Taking Active Self, Pharmacy Records  lidocaine (LIDODERM) 5 % [id number] No Place 1 patch onto the skin daily as needed. Apply patch to area most significant pain once per day.  Remove and discard patch within 12 hours of application.  Patient not taking: Reported on 08/07/2022   Amaryllis Dyke, PA-C Not Taking Active   methocarbamol (ROBAXIN) 500 MG tablet [id number] Yes Take 1 tablet (500 mg total) by mouth every 8 (eight) hours as needed for muscle spasms. Petrucelli, Glynda Jaeger, PA-C Taking Active   metoCLOPramide (REGLAN) 5 MG tablet [id number] Yes Take 1 tablet (5 mg total) by mouth every 8 (eight) hours as needed for nausea. Jeanell Sparrow, DO Taking Active   Multiple Vitamin (MULTIVITAMIN) tablet [id number] Yes Take 1 tablet by mouth daily. [provider] Taking Active   naproxen (NAPROSYN) 500 MG tablet [id number] Yes Take 1 tablet (500 mg total) by mouth 2 (two) times daily as needed for moderate pain. Petrucelli, Glynda Jaeger, PA-C Taking Active             There are no problems to display for this patient.   Conditions to be addressed/monitored per PCP order:   dental resources  Care Plan : [place] of Care  Updates made by Ethelda Chick since 08/22/2022 12:00 AM     Problem: Health Management needs related to Gastritis      Long-Range Goal: Development of Plan of Care to address Health Management needs related to Gastritis   Start Date: 08/07/2022  Expected End Date: 11/05/2022  Priority: High  Note:   Current Barriers:  Knowledge Deficits related  to plan of care for management of gastritis  Ms. Worthy has multiple ED visits in the last 6 months. She has not established care with a PCP since living in [place] over the last 3 years. She was unaware of hospital follow up on 08/14/22 and would like to establish care during that visit. She expresses concern about her intolerance to certain foods and the amount of  food. She feels that her toddler can eat more than she does.    RNCM Clinical Goal(s):  Patient will verbalize understanding of plan for management of Gastritis as evidenced by patient reports attend all scheduled medical appointments: 08/14/22 with Dr. Wynetta Emery as evidenced by provider documentation in EMR          Interventions: Inter-disciplinary care team collaboration (see longitudinal plan of care) Evaluation of current treatment plan related to  self management and patient's adherence to plan as established by provider BSW completed a telephone outreach with patient for dental resources. Patient stated she just moved to [place] from [place] completed SDOH assessment and no other resources are needed at this time.   Gastritis  (Status: New goal.) Long Term Goal  Evaluation of current treatment plan related to  Gastritis ,  self-management and patient's adherence to plan as established by provider. Discussed plans with patient for ongoing care management follow up and provided patient with direct contact information for care management team Advised patient to discuss any concerns or questions with PCP during visit on 08/14/22; Reviewed medications with patient and discussed taking as needed; Provided patient with MyChart educational materials related to gastritis; Reviewed scheduled/upcoming provider appointments including 08/14/22 with Dr. Wynetta Emery; Social Work referral for dental provider, scheduled on 08/15/22 @ 3pm; Discussed plans with patient for ongoing care management follow up and provided patient with direct contact information for care management team; Discussed foods to avoid, advised patient to start a journal and write down foods that upset her stomach Discussed the importance of having a PCP   Patient Goals/Self-Care Activities: Take medications as prescribed   Attend all scheduled provider appointments Call provider office for new concerns or questions  Work with  the social worker to address care coordination needs and will continue to work with the clinical team to address health care and disease management related needs       Follow up:  Patient agrees to Care Plan and Follow-up.  Plan: The Managed Medicaid care management team will reach out to the patient again over the next 30 days.  Date/time of next scheduled Social Work care management/care coordination outreach:  09/22/22  Mickel Fuchs, Arita Miss, [place] Medicaid Team  [phone]

## 2022-08-25 ENCOUNTER — Inpatient Hospital Stay: Admission: RE | Admit: 2022-08-25 | Payer: Medicaid Other | Source: Ambulatory Visit

## 2022-09-01 ENCOUNTER — Ambulatory Visit
Admission: RE | Admit: 2022-09-01 | Discharge: 2022-09-01 | Disposition: A | Discharge status: Home / Self Care | Payer: Medicaid Other | Source: Ambulatory Visit | Attending: Internal Medicine | Admitting: Internal Medicine

## 2022-09-01 ENCOUNTER — Ambulatory Visit: Payer: Medicaid Other | Attending: Cardiovascular Disease | Admitting: Cardiovascular Disease

## 2022-09-01 ENCOUNTER — Encounter: Payer: Self-pay | Admitting: Cardiovascular Disease

## 2022-09-01 VITALS — BP 98/64 | HR 65 | Ht 63.0 in | Wt 105.8 lb

## 2022-09-01 DIAGNOSIS — R1010 Upper abdominal pain, unspecified: Secondary | ICD-10-CM

## 2022-09-01 DIAGNOSIS — R072 Precordial pain: Secondary | ICD-10-CM | POA: Diagnosis not present

## 2022-09-01 DIAGNOSIS — R079 Chest pain, unspecified: Secondary | ICD-10-CM | POA: Diagnosis not present

## 2022-09-01 DIAGNOSIS — R109 Unspecified abdominal pain: Secondary | ICD-10-CM | POA: Diagnosis not present

## 2022-09-01 DIAGNOSIS — R1110 Vomiting, unspecified: Secondary | ICD-10-CM | POA: Diagnosis not present

## 2022-09-01 NOTE — Patient Instructions (Signed)
Medication Instructions:  No changes *If you need a refill on your cardiac medications before your next appointment, please call your pharmacy*   Lab Work: none If you have labs (blood work) drawn today and your tests are completely normal, you will receive your results only by: [place] (if you have MyChart) OR A paper copy in the mail If you have any lab test that is abnormal or we need to change your treatment, we will call you to review the results.   Testing/Procedures: Your physician has requested that you have an echocardiogram. Echocardiography is a painless test that uses sound waves to create images of your heart. It provides your doctor with information about the size and shape of your heart and how well your heart's chambers and valves are working. This procedure takes approximately one hour. There are no restrictions for this procedure. Please do NOT wear cologne, perfume, aftershave, or lotions (deodorant is allowed). Please arrive 15 minutes prior to your appointment time.  Cardiac CTA - see instructions below   Follow-Up: As needed  Other Instructions   Your cardiac CT will be scheduled at  [facility] [place], [place] [address] [phone]  Please arrive at the [facility] and Children[facility] Entrance (Entrance C2) of [facility] 30 minutes prior to test start time. You can use the FREE valet parking offered at entrance C (encouraged to control the heart rate for the test)  Proceed to the [facility] Radiology Department (first floor) to check-in and test prep.  All radiology patients and guests should use entrance C2 at [facility], accessed from [facility], even though the hospital's physical address listed is [address]      Please follow these instructions carefully (unless otherwise directed):  On the Night Before the Test: Be sure to Drink plenty of water. Do not consume any  caffeinated/decaffeinated beverages or chocolate 12 hours prior to your test. Do not take any antihistamines 12 hours prior to your test.  On the Day of the Test: Drink plenty of water until 1 hour prior to the test. Do not eat any food 1 hour prior to test. You may take your regular medications prior to the test.  FEMALES- please wear underwire-free bra if available, avoid dresses & tight clothing  After the Test: Drink plenty of water. After receiving IV contrast, you may experience a mild flushed feeling. This is normal. On occasion, you may experience a mild rash up to 24 hours after the test. This is not dangerous. If this occurs, you can take Benadryl 25 mg and increase your fluid intake. If you experience trouble breathing, this can be serious. If it is severe call 911 IMMEDIATELY. If it is mild, please call our office. If you take any of these medications: Glipizide/Metformin, Avandament, Glucavance, please do not take 48 hours after completing test unless otherwise instructed.  We will call to schedule your test 2-4 weeks out understanding that some insurance companies will need an authorization prior to the service being performed.   For non-scheduling related questions, please contact the cardiac imaging nurse navigator should you have any questions/concerns: Marchia Bond, Cardiac Imaging Nurse Navigator Gordy Clement, Cardiac Imaging Nurse Navigator [facility] Direct Office Dial: [phone]   For scheduling needs, including cancellations and rescheduling, please call [place], [phone].

## 2022-09-01 NOTE — Progress Notes (Signed)
Chief Complaint  Patient presents with   New Patient (Initial Visit)    Chest pain   History of Present Illness: 26 yo female with history of chest pain, abdominal pain and anemia who is here today as a new consult, referred by Dr. Jonah Blue, for the evaluation of chest pain. She was seen in the ED at [facility] 07/13/22 with chest pain. Troponin negative. EKG without ischemic changes. Her pain was reproducible to palpation and was felt to be musculoskeletal. She was seen in primary care and was started on Protonix for possible GERD. She was referred to GI but has not yet been seen in the GI clinic. The addition of Protonix has not helped her chest pain. She tells me today that her chest hurts every day. This can be in the right chest wall or left chest wall and worsens with palpations. No worsening with exertion. No associated dyspnea, diaphoresis. She has had multiple ED visits with chest pain. She works as a Museum/gallery exhibitions officer.   Primary Care Physician: Marcine Matar, MD   Past Medical History:  Diagnosis Date   Abdominal pain    Abscess of left external ear    Acute gastritis without hemorrhage    Chest pain    Chest wall pain    Costochondritis    Dysmenorrhea    Epigastric abdominal pain    Headache    Near syncope    Other iron deficiency anemia    Rash    Shoulder pain    Sore throat    Strep pharyngitis    Tinea corporis     Past Surgical History:  Procedure Laterality Date   TUBAL LIGATION Bilateral 2020    Current Outpatient Medications  Medication Sig Dispense Refill   acetaminophen (TYLENOL) 500 MG tablet Take 500 mg by mouth every 6 (six) hours as needed for mild pain.      adapalene (DIFFERIN) 0.1 % cream Apply to the face at bedtime twice a week 45 g 0   clindamycin (CLEOCIN T) 1 % external solution Apply topically 2 (two) times daily. 30 mL 0   methocarbamol (ROBAXIN) 500 MG tablet Take 1 tablet (500 mg total) by mouth every 8 (eight)  hours as needed for muscle spasms. 15 tablet 0   metoCLOPramide (REGLAN) 5 MG tablet Take 1 tablet (5 mg total) by mouth every 8 (eight) hours as needed for nausea. 10 tablet 0   Multiple Vitamin (MULTIVITAMIN) tablet Take 1 tablet by mouth daily.     pantoprazole (PROTONIX) 40 MG tablet Take 1 tablet (40 mg total) by mouth daily. 30 tablet 3   No current facility-administered medications for this visit.    No Known Allergies  Social History   Socioeconomic History   Marital status: Significant Other    Spouse name: Not on file   Number of children: 3   Years of education: Not on file   Highest education level: Not on file  Occupational History   Occupation: Forklift driver  Tobacco Use   Smoking status: Never   Smokeless tobacco: Never  Substance and Sexual Activity   Alcohol use: Not Currently   Drug use: Yes    Types: Marijuana   Sexual activity: Yes  Other Topics Concern   Not on file  Social History Narrative   Not on file   Social Determinants of Health   Financial Resource Strain: Low Risk  (08/22/2022)   Overall Financial Resource Strain (CARDIA)  Difficulty of Paying Living Expenses: Not hard at all  Food Insecurity: No Food Insecurity (08/22/2022)   Hunger Vital Sign    Worried About Running Out of Food in the Last Year: Never true    Ran Out of Food in the Last Year: Never true  Transportation Needs: No Transportation Needs (08/07/2022)   PRAPARE - Hydrologist (Medical): No    Lack of Transportation (Non-Medical): No  Physical Activity: Not on file  Stress: Not on file  Social Connections: Not on file  Intimate Partner Violence: Not on file    Family History  Problem Relation Age of Onset   Drug abuse Father     Review of Systems:  As stated in the HPI and otherwise negative.   BP 98/64 (BP Location: Right Arm, Patient Position: Sitting, Cuff Size: Normal)   Pulse 65   Ht 5\' 3"  (1.6 m)   Wt 105 lb 12.8 oz (48 kg)    LMP 08/12/2022   SpO2 98%   BMI 18.74 kg/m   Physical Examination: General: Well developed, well nourished, NAD  HEENT: OP clear, mucus membranes moist  SKIN: warm, dry. No rashes. Neuro: No focal deficits  Musculoskeletal: Muscle strength 5/5 all ext  Psychiatric: Mood and affect normal  Neck: No JVD, no carotid bruits, no thyromegaly, no lymphadenopathy.  Lungs:Clear bilaterally, no wheezes, rhonci, crackles Cardiovascular: Regular rate and rhythm. No murmurs, gallops or rubs. Abdomen:Soft. Bowel sounds present. Non-tender.  Extremities: No lower extremity edema. Pulses are 2 + in the bilateral DP/PT.  EKG:  EKG is ordered today. The ekg ordered today demonstrates NSR, non-specific ST abnormality lateral leads.   Recent Labs: 06/16/2022: Magnesium 1.6 07/13/2022: BUN 10; Creatinine, Ser 0.79; Hemoglobin 12.7; Platelets 277; Potassium 3.6; Sodium 141 08/14/2022: ALT 10; TSH 0.560   Lipid Panel No results found for: "CHOL", "TRIG", "HDL", "CHOLHDL", "VLDL", "LDLCALC", "LDLDIRECT"   Wt Readings from Last 3 Encounters:  09/01/22 105 lb 12.8 oz (48 kg)  08/14/22 104 lb 3.2 oz (47.3 kg)  07/13/22 98 lb (44.5 kg)      Assessment and Plan:   1. Chest pain; Her pain is atypical but her EKG is slightly abnormal. Given multiple ED visits with chest pain, will arrange a coronary CTA to fully exclude CAD. Will arrange an echo to assess LVEF and exclude structural heart disease.   Labs/ tests ordered today include:   Orders Placed This Encounter  Procedures   CT CORONARY MORPH W/CTA COR W/SCORE W/CA W/CM &/OR WO/CM   EKG 12-Lead   ECHOCARDIOGRAM COMPLETE    Disposition:   F/U with me as needed.    Signed, Lauree Chandler, MD, [facility] 09/01/2022 3:54 PM    [address], [place], [place]  [address] Phone: [phone]; Fax: [phone]

## 2022-09-22 ENCOUNTER — Other Ambulatory Visit: Payer: Medicaid Other

## 2022-09-22 NOTE — Patient Instructions (Signed)
  Medicaid Managed Care   Unsuccessful Outreach Note  09/22/2022 Name: Sharon Day MRN: [id number] DOB: 07/20/1996  Referred by: Johnson, Deborah B, MD Reason for referral : High Risk Managed Medicaid (MM social work telephone outreach )   An unsuccessful telephone outreach was attempted today. The patient was referred to the case management team for assistance with care management and care coordination.   Follow Up Plan: A HIPAA compliant phone message was left for the patient providing contact information and requesting a return call.   Anusha Claus, BSW, MHA [facility]  [place]  High Risk Managed Medicaid Team  [phone]  

## 2022-09-22 NOTE — Patient Outreach (Signed)
  Medicaid Managed Care   Unsuccessful Outreach Note  09/22/2022 Name: Sharon Day MRN: [id number] DOB: 01-19-1996  Referred by: Marcine Matar, MD Reason for referral : High Risk Managed Medicaid (MM social work telephone outreach )   An unsuccessful telephone outreach was attempted today. The patient was referred to the case management team for assistance with care management and care coordination.   Follow Up Plan: A HIPAA compliant phone message was left for the patient providing contact information and requesting a return call.   Gus Puma, BSW, [place] [facility]  [place]  High Risk Managed Medicaid Team  [phone]

## 2022-09-23 ENCOUNTER — Ambulatory Visit (HOSPITAL_COMMUNITY): Payer: Medicaid Other

## 2022-10-06 ENCOUNTER — Telehealth (HOSPITAL_COMMUNITY): Payer: Self-pay | Admitting: Emergency Medicine

## 2022-10-06 NOTE — Telephone Encounter (Signed)
Attempted to call patient regarding upcoming cardiac CT appointment. °Left message on voicemail with name and callback number °Aidan Caloca RN Navigator Cardiac Imaging °[facility] °[phone] Office °[phone] Cell ° °

## 2022-10-07 ENCOUNTER — Other Ambulatory Visit: Payer: Medicaid Other | Admitting: *Deleted

## 2022-10-07 ENCOUNTER — Ambulatory Visit (HOSPITAL_COMMUNITY): Admission: RE | Admit: 2022-10-07 | Payer: Medicaid Other | Source: Ambulatory Visit

## 2022-10-07 NOTE — Patient Outreach (Signed)
  Medicaid Managed Care   Unsuccessful Attempt Note   10/07/2022 Name: Sharon Day MRN: [id number] DOB: 03/26/1996  Referred by: Marcine Matar, MD Reason for referral : No chief complaint on file.   An unsuccessful telephone outreach was attempted today. The patient was referred to the case management team for assistance with care management and care coordination.    Follow Up Plan: A HIPAA compliant phone message was left for the patient providing contact information and requesting a return call. and The Managed Medicaid care management team will reach out to the patient again over the next 14 days.    Estanislado Emms RN, BSN [place]  [facility]

## 2022-10-07 NOTE — Patient Instructions (Signed)
Visit Information  Ms. Sharon Day  - as a part of your Medicaid benefit, you are eligible for care management and care coordination services at no cost or copay. I was unable to reach you by phone today but would be happy to help you with your health related needs. Please feel free to call me @ [phone].   A member of the Managed Medicaid care management team will reach out to you again over the next 14 days.   Estanislado Emms RN, BSN [place]  [facility]

## 2022-10-14 ENCOUNTER — Encounter (HOSPITAL_COMMUNITY): Payer: Self-pay | Admitting: Cardiovascular Disease

## 2022-10-14 ENCOUNTER — Other Ambulatory Visit (HOSPITAL_COMMUNITY): Payer: Medicaid Other

## 2022-10-20 ENCOUNTER — Telehealth: Payer: Self-pay

## 2022-10-20 NOTE — Telephone Encounter (Signed)
..   Medicaid Managed Care Note  10/20/2022 Name: Sharon Day MRN: [id number] DOB: 06/30/1996  Sharon Day is a 26 y.o. year old female who is a primary care patient of Marcine Matar, MD and is actively engaged with the care management team. I reached out to Ulyses Amor by phone today to assist with re-scheduling a follow up visit with the RN Case Manager  Follow up plan: Unsuccessful telephone outreach attempt made. A HIPAA compliant phone message was left for the patient providing contact information and requesting a return call.  The care management team will reach out to the patient again over the next 7 days.   Weston Settle Care Guide, High Risk Medicaid Managed Care Embedded Care Coordination [facility]  [facility]

## 2022-10-25 DIAGNOSIS — B001 Herpesviral vesicular dermatitis: Secondary | ICD-10-CM | POA: Diagnosis not present

## 2022-10-25 DIAGNOSIS — Z113 Encounter for screening for infections with a predominantly sexual mode of transmission: Secondary | ICD-10-CM | POA: Diagnosis not present

## 2022-10-29 ENCOUNTER — Telehealth: Payer: Self-pay

## 2022-10-29 NOTE — Telephone Encounter (Signed)
..   Medicaid Managed Care Note  10/29/2022 Name: Sharon Day MRN: [id number] DOB: Oct 17, 1996  Sharon Day is a 26 y.o. year old female who is a primary care patient of Marcine Matar, MD and is actively engaged with the care management team. I reached out to Ulyses Amor by phone today to assist with re-scheduling a follow up visit with the RN Case Manager  Follow up plan: Unsuccessful telephone outreach attempt made. A HIPAA compliant phone message was left for the patient providing contact information and requesting a return call.  We have been unable to make contact with the patient for follow up. The care management team is available to follow up with the patient after provider conversation with the patient regarding recommendation for care management engagement and subsequent re-referral to the care management team.   Weston Settle Care Guide, High Risk Medicaid Managed Care Embedded Care Coordination [facility]  [facility]

## 2022-11-04 ENCOUNTER — Other Ambulatory Visit: Payer: Medicaid Other | Admitting: *Deleted

## 2022-11-04 NOTE — Patient Outreach (Cosign Needed)
Care Coordination  11/04/2022  Sharon Day 1995-12-29 [id number]    Medicaid Managed Care   Unsuccessful Outreach Note  11/04/2022 Name: Sharon Day MRN: [id number] DOB: [birth year]  Referred by: Ladell Pier, MD Reason for referral : Case Closure (RNCM performing Case Closure)   Three unsuccessful telephone outreach attempts have been made. The patient was referred to the case management team for assistance with care management and care coordination. The patient's primary care provider has been notified of our unsuccessful attempts to make or maintain contact with the patient. The care management team is pleased to engage with this patient at any time in the future should he/she be interested in assistance from the care management team.   Follow Up Plan: We have been unable to make contact with the patient for follow up. The care management team is available to follow up with the patient after provider conversation with the patient regarding recommendation for care management engagement and subsequent re-referral to the care management team.   Lurena Joiner RN, BSN [place] RN Care Coordinator

## 2022-11-07 ENCOUNTER — Encounter (HOSPITAL_COMMUNITY): Payer: Self-pay | Admitting: *Deleted

## 2022-11-07 ENCOUNTER — Ambulatory Visit (HOSPITAL_COMMUNITY)
Admission: EM | Admit: 2022-11-07 | Discharge: 2022-11-07 | Disposition: A | Discharge status: Home / Self Care | Payer: Medicaid Other | Attending: Family Medicine | Admitting: Family Medicine

## 2022-11-07 DIAGNOSIS — J069 Acute upper respiratory infection, unspecified: Secondary | ICD-10-CM | POA: Diagnosis not present

## 2022-11-07 DIAGNOSIS — Z1152 Encounter for screening for COVID-19: Secondary | ICD-10-CM | POA: Diagnosis not present

## 2022-11-07 DIAGNOSIS — R1010 Upper abdominal pain, unspecified: Secondary | ICD-10-CM | POA: Diagnosis present

## 2022-11-07 DIAGNOSIS — Z79899 Other long term (current) drug therapy: Secondary | ICD-10-CM | POA: Diagnosis not present

## 2022-11-07 DIAGNOSIS — R0789 Other chest pain: Secondary | ICD-10-CM | POA: Diagnosis not present

## 2022-11-07 MED ORDER — LIDOCAINE VISCOUS HCL 2 % MT SOLN
15.0000 mL | Freq: Once | OROMUCOSAL | Status: AC
  Administered 2022-11-07: 15 mL via OROMUCOSAL

## 2022-11-07 MED ORDER — ALUM & MAG HYDROXIDE-SIMETH 200-200-20 MG/5ML PO SUSP
ORAL | Status: AC
  Filled 2022-11-07: qty 30

## 2022-11-07 MED ORDER — PANTOPRAZOLE SODIUM 40 MG PO TBEC: 40.0000 mg | DELAYED_RELEASE_TABLET | Freq: Every day | ORAL | 0 refills | Status: DC

## 2022-11-07 MED ORDER — LIDOCAINE VISCOUS HCL 2 % MT SOLN
OROMUCOSAL | Status: AC
  Filled 2022-11-07: qty 15

## 2022-11-07 MED ORDER — ALUM & MAG HYDROXIDE-SIMETH 200-200-20 MG/5ML PO SUSP
30.0000 mL | Freq: Once | ORAL | Status: AC
  Administered 2022-11-07: 30 mL via ORAL

## 2022-11-07 NOTE — ED Provider Notes (Signed)
Sharon Day    CSN: [id number] Arrival date & time: 11/07/22  4098      History   Chief Complaint Chief Complaint  Patient presents with   Cough   Headache   Gastroesophageal Reflux    HPI Sharon Day is a 27 y.o. female.    Cough Associated symptoms: headaches   Headache Associated symptoms: cough   Gastroesophageal Reflux Associated symptoms include headaches.   Here for central chest pain that began yesterday after she had eaten at a soul food restaurant.  It has been fairly constant.  Deep breath does not make it worse.  She has had some headache and a little cough since yesterday also.  No fever.  No vomiting or diarrhea   Has had reflux in the past and feels this is similar.  She is no longer taking Protonix.  Last menstrual cycle was December 10 A lidocaine patch has provided some relief  Past Medical History:  Diagnosis Date   Abdominal pain    Abscess of left external ear    Acute gastritis without hemorrhage    Chest pain    Chest wall pain    Costochondritis    Dysmenorrhea    Epigastric abdominal pain    Headache    Near syncope    Other iron deficiency anemia    Rash    Shoulder pain    Sore throat    Strep pharyngitis    Tinea corporis     There are no problems to display for this patient.   Past Surgical History:  Procedure Laterality Date   TUBAL LIGATION Bilateral 2020    OB History   No obstetric history on file.      Home Medications    Prior to Admission medications   Medication Sig Start Date End Date Taking? Authorizing Provider  acetaminophen (TYLENOL) 500 MG tablet Take 500 mg by mouth every 6 (six) hours as needed for mild pain.     [provider]  adapalene (DIFFERIN) 0.1 % cream Apply to the face at bedtime twice a week 08/14/22   Ladell Pier, MD  clindamycin (CLEOCIN T) 1 % external solution Apply topically 2 (two) times daily. 08/18/22   Ladell Pier, MD  methocarbamol (ROBAXIN)  500 MG tablet Take 1 tablet (500 mg total) by mouth every 8 (eight) hours as needed for muscle spasms. 07/14/22   Petrucelli, Samantha R, PA-C  metoCLOPramide (REGLAN) 5 MG tablet Take 1 tablet (5 mg total) by mouth every 8 (eight) hours as needed for nausea. 06/16/22   Jeanell Sparrow, DO  Multiple Vitamin (MULTIVITAMIN) tablet Take 1 tablet by mouth daily.    [provider]  pantoprazole (PROTONIX) 40 MG tablet Take 1 tablet (40 mg total) by mouth daily. 11/07/22   Barrett Henle, MD    Family History Family History  Problem Relation Age of Onset   Drug abuse Father     Social History Social History   Tobacco Use   Smoking status: Never   Smokeless tobacco: Never  Vaping Use   Vaping Use: Never used  Substance Use Topics   Alcohol use: Not Currently   Drug use: Not Currently    Types: Marijuana     Allergies   Patient has no known allergies.   Review of Systems Review of Systems  Respiratory:  Positive for cough.   Neurological:  Positive for headaches.     Physical Exam Triage Vital Signs ED Triage  Vitals  Enc Vitals Group     BP 11/07/22 0909 97/63     Pulse Rate 11/07/22 0909 78     Resp 11/07/22 0909 18     Temp 11/07/22 0909 98.3 F (36.8 C)     Temp Source 11/07/22 0909 Oral     SpO2 11/07/22 0909 98 %     Weight --      Height --      Head Circumference --      Peak Flow --      Pain Score 11/07/22 0903 8     Pain Loc --      Pain Edu? --      Excl. in [place]? --    No data found.  Updated Vital Signs BP 97/63 (BP Location: Left Arm)   Pulse 78   Temp 98.3 F (36.8 C) (Oral)   Resp 18   LMP 10/12/2022 (Exact Date)   SpO2 98%   Visual Acuity Right Eye Distance:   Left Eye Distance:   Bilateral Distance:    Right Eye Near:   Left Eye Near:    Bilateral Near:     Physical Exam Vitals reviewed.  Constitutional:      General: She is not in acute distress.    Appearance: She is not toxic-appearing.  HENT:     Nose: Nose  normal.     Mouth/Throat:     Mouth: Mucous membranes are moist.     Pharynx: No oropharyngeal exudate or posterior oropharyngeal erythema.  Eyes:     Extraocular Movements: Extraocular movements intact.     Conjunctiva/sclera: Conjunctivae normal.     Pupils: Pupils are equal, round, and reactive to light.  Cardiovascular:     Rate and Rhythm: Normal rate and regular rhythm.     Heart sounds: No murmur heard. Pulmonary:     Effort: Pulmonary effort is normal. No respiratory distress.     Breath sounds: No stridor. No wheezing, rhonchi or rales.  Chest:     Chest wall: No tenderness (She is very tender in the central chest over her sternum where the lidocaine patch is.).  Musculoskeletal:     Cervical back: Neck supple.  Lymphadenopathy:     Cervical: No cervical adenopathy.  Skin:    Capillary Refill: Capillary refill takes less than 2 seconds.     Coloration: Skin is not jaundiced or pale.  Neurological:     General: No focal deficit present.     Mental Status: She is alert and oriented to person, place, and time.  Psychiatric:        Behavior: Behavior normal.      UC Treatments / Results  Labs (all labs ordered are listed, but only abnormal results are displayed) Labs Reviewed  SARS CORONAVIRUS 2 (TAT 6-24 HRS)    EKG   Radiology No results found.  Procedures Procedures (including critical care time)  Medications Ordered in UC Medications  alum & mag hydroxide-simeth (MAALOX/MYLANTA) 200-200-20 MG/5ML suspension 30 mL (30 mLs Oral Given 11/07/22 0929)  lidocaine (XYLOCAINE) 2 % viscous mouth solution 15 mL (15 mLs Mouth/Throat Given 11/07/22 0929)    Initial Impression / Assessment and Plan / UC Course  I have reviewed the triage vital signs and the nursing notes.  Pertinent labs & imaging results that were available during my care of the patient were reviewed by me and considered in my medical decision making (see chart for details).  With her  chest tenderness, I wanted to try to sort out if this were esophageal pain or if it was chest wall pain.  Order was given to administer a GI cocktail.  The patient swallowed a small amount but she immediately threw back up.    On review of prior notes, she was also tender when she got started on the Protonix and the Protonix ended up helping. Protonix is sent in and she will continue using the lidocaine patches.  With the new cough and headache and congestion, a COVID swab is done. Final Clinical Impressions(s) / UC Diagnoses   Final diagnoses:  Viral upper respiratory tract infection  Atypical chest pain     Discharge Instructions      Pantoprazole 40 mg--take 1 daily for reflux.  Continue using the lidocaine patches as needed.   You have been swabbed for COVID, and the test will result in the next 24 hours. Our staff will call you if positive. If the COVID test is positive, you should quarantine for 5 days from the start of your symptoms      ED Prescriptions     Medication Sig Dispense Auth. Provider   pantoprazole (PROTONIX) 40 MG tablet Take 1 tablet (40 mg total) by mouth daily. 30 tablet Analisse Randle, Gwenlyn Perking, MD      PDMP not reviewed this encounter.   Barrett Henle, MD 11/07/22 [phone]

## 2022-11-07 NOTE — Discharge Instructions (Signed)
Pantoprazole 40 mg--take 1 daily for reflux.  Continue using the lidocaine patches as needed.   You have been swabbed for COVID, and the test will result in the next 24 hours. Our staff will call you if positive. If the COVID test is positive, you should quarantine for 5 days from the start of your symptoms

## 2022-11-07 NOTE — ED Triage Notes (Signed)
Pt states that she has a dx of GERD and yesterday she ate a a restaurant called [employer] and she has been having chest discomfort like her reflux is acting up. She complains of headache and a slight cough as well. She took some Tylenol without relief. She also applied a OTC lidocaine patch with some relief.   Last time she was given Protonix and it worked Engineer, manufacturing.

## 2022-11-08 LAB — SARS CORONAVIRUS 2 (TAT 6-24 HRS): SARS Coronavirus 2: NEGATIVE

## 2022-11-14 ENCOUNTER — Encounter (HOSPITAL_COMMUNITY): Payer: Self-pay | Admitting: Emergency Medicine

## 2022-11-14 ENCOUNTER — Ambulatory Visit (HOSPITAL_COMMUNITY)
Admission: EM | Admit: 2022-11-14 | Discharge: 2022-11-14 | Disposition: A | Discharge status: Home / Self Care | Payer: Medicaid Other | Attending: Physician Assistant | Admitting: Physician Assistant

## 2022-11-14 DIAGNOSIS — R52 Pain, unspecified: Secondary | ICD-10-CM | POA: Diagnosis present

## 2022-11-14 DIAGNOSIS — R059 Cough, unspecified: Secondary | ICD-10-CM | POA: Diagnosis present

## 2022-11-14 DIAGNOSIS — Z8616 Personal history of COVID-19: Secondary | ICD-10-CM | POA: Diagnosis not present

## 2022-11-14 DIAGNOSIS — Z1152 Encounter for screening for COVID-19: Secondary | ICD-10-CM | POA: Insufficient documentation

## 2022-11-14 DIAGNOSIS — J069 Acute upper respiratory infection, unspecified: Secondary | ICD-10-CM | POA: Insufficient documentation

## 2022-11-14 DIAGNOSIS — Z28310 Unvaccinated for covid-19: Secondary | ICD-10-CM | POA: Insufficient documentation

## 2022-11-14 LAB — POC INFLUENZA A AND B ANTIGEN (URGENT CARE ONLY)
INFLUENZA A ANTIGEN, POC: NEGATIVE
INFLUENZA B ANTIGEN, POC: NEGATIVE

## 2022-11-14 LAB — SARS CORONAVIRUS 2 (TAT 6-24 HRS): SARS Coronavirus 2: NEGATIVE

## 2022-11-14 MED ORDER — PROMETHAZINE-DM 6.25-15 MG/5ML PO SYRP: 5.0000 mL | ORAL_SOLUTION | Freq: Three times a day (TID) | ORAL | 0 refills | Status: DC | PRN

## 2022-11-14 NOTE — ED Provider Notes (Signed)
[facility]    CSN: [id number] Arrival date & time: 11/14/22  0816      History   Chief Complaint Chief Complaint  Patient presents with   Generalized Body Aches   Chills    HPI Sharon Day is a 27 y.o. female.   Patient presents today with a 24-hour history of URI symptoms including cough, congestion, body aches, chills, sore throat, headache.  Denies any chest pain, shortness of breath, nausea/vomiting.  She was seen by our clinic on 11/07/2022 with chest discomfort but reports that the symptoms have resolved.  At that time she was tested for COVID and was negative.  She has had COVID with last episode in 2022.  She has not had COVID or influenza vaccines.  She has not tried any over-the-counter medication for symptom management.  Reports that her boyfriend had influenza a few weeks ago but she was not around him during his illness.  She denies any recent antibiotics or steroids.  Denies any significant past medical history including allergies, asthma, COPD, smoking.  She is confident that she is not pregnant.    Past Medical History:  Diagnosis Date   Abdominal pain    Abscess of left external ear    Acute gastritis without hemorrhage    Chest pain    Chest wall pain    Costochondritis    Dysmenorrhea    Epigastric abdominal pain    Headache    Near syncope    Other iron deficiency anemia    Rash    Shoulder pain    Sore throat    Strep pharyngitis    Tinea corporis     There are no problems to display for this patient.   Past Surgical History:  Procedure Laterality Date   TUBAL LIGATION Bilateral 2020    OB History   No obstetric history on file.      Home Medications    Prior to Admission medications   Medication Sig Start Date End Date Taking? Authorizing Provider  promethazine-dextromethorphan (PROMETHAZINE-DM) 6.25-15 MG/5ML syrup Take 5 mLs by mouth 3 (three) times daily as needed for cough. 11/14/22  Yes Brynnlee Cumpian, Noberto Retort, PA-C   acetaminophen (TYLENOL) 500 MG tablet Take 500 mg by mouth every 6 (six) hours as needed for mild pain.     [provider]  adapalene (DIFFERIN) 0.1 % cream Apply to the face at bedtime twice a week 08/14/22   Marcine Matar, MD  clindamycin (CLEOCIN T) 1 % external solution Apply topically 2 (two) times daily. 08/18/22   Marcine Matar, MD  methocarbamol (ROBAXIN) 500 MG tablet Take 1 tablet (500 mg total) by mouth every 8 (eight) hours as needed for muscle spasms. 07/14/22   Petrucelli, Samantha R, PA-C  metoCLOPramide (REGLAN) 5 MG tablet Take 1 tablet (5 mg total) by mouth every 8 (eight) hours as needed for nausea. 06/16/22   Sloan Leiter, DO  Multiple Vitamin (MULTIVITAMIN) tablet Take 1 tablet by mouth daily.    [provider]  pantoprazole (PROTONIX) 40 MG tablet Take 1 tablet (40 mg total) by mouth daily. 11/07/22   Zenia Resides, MD    Family History Family History  Problem Relation Age of Onset   Drug abuse Father     Social History Social History   Tobacco Use   Smoking status: Never   Smokeless tobacco: Never  Vaping Use   Vaping Use: Never used  Substance Use Topics   Alcohol use:  Not Currently   Drug use: Not Currently    Types: Marijuana     Allergies   Patient has no known allergies.   Review of Systems Review of Systems  Constitutional:  Positive for activity change, chills and fatigue. Negative for appetite change and fever.  HENT:  Positive for congestion. Negative for sinus pressure, sneezing and sore throat.   Respiratory:  Positive for cough. Negative for shortness of breath.   Cardiovascular:  Negative for chest pain.  Gastrointestinal:  Negative for abdominal pain, diarrhea, nausea and vomiting.  Musculoskeletal:  Positive for arthralgias and myalgias.  Neurological:  Positive for headaches. Negative for dizziness and light-headedness.     Physical Exam Triage Vital Signs ED Triage Vitals  Enc Vitals Group      BP 11/14/22 1010 112/79     Pulse Rate 11/14/22 1010 80     Resp 11/14/22 1010 15     Temp 11/14/22 1010 98.1 F (36.7 C)     Temp Source 11/14/22 1010 Oral     SpO2 11/14/22 1010 99 %     Weight --      Height --      Head Circumference --      Peak Flow --      Pain Score 11/14/22 1011 7     Pain Loc --      Pain Edu? --      Excl. in [place]? --    No data found.  Updated Vital Signs BP 112/79 (BP Location: Left Arm)   Pulse 80   Temp 98.1 F (36.7 C) (Oral)   Resp 15   LMP 10/12/2022 (Exact Date)   SpO2 99%   Visual Acuity Right Eye Distance:   Left Eye Distance:   Bilateral Distance:    Right Eye Near:   Left Eye Near:    Bilateral Near:     Physical Exam Vitals reviewed.  Constitutional:      General: She is awake. She is not in acute distress.    Appearance: Normal appearance. She is well-developed. She is not ill-appearing.     Comments: Very pleasant female appears stated age in no acute distress sitting comfortably in exam room  HENT:     Head: Normocephalic and atraumatic.     Right Ear: Tympanic membrane, ear canal and external ear normal. Tympanic membrane is not erythematous or bulging.     Left Ear: Tympanic membrane, ear canal and external ear normal. Tympanic membrane is not erythematous or bulging.     Nose:     Right Sinus: No maxillary sinus tenderness or frontal sinus tenderness.     Left Sinus: No maxillary sinus tenderness or frontal sinus tenderness.     Mouth/Throat:     Pharynx: Uvula midline. No oropharyngeal exudate or posterior oropharyngeal erythema.  Cardiovascular:     Rate and Rhythm: Normal rate and regular rhythm.     Heart sounds: Normal heart sounds, S1 normal and S2 normal. No murmur heard. Pulmonary:     Effort: Pulmonary effort is normal.     Breath sounds: Normal breath sounds. No wheezing, rhonchi or rales.     Comments: Clear to auscultation bilaterally Psychiatric:        Behavior: Behavior is cooperative.      UC  Treatments / Results  Labs (all labs ordered are listed, but only abnormal results are displayed) Labs Reviewed  SARS CORONAVIRUS 2 (TAT 6-24 HRS)  POC INFLUENZA A AND B ANTIGEN (URGENT CARE  ONLY)    EKG   Radiology No results found.  Procedures Procedures (including critical care time)  Medications Ordered in UC Medications - No data to display  Initial Impression / Assessment and Plan / UC Course  I have reviewed the triage vital signs and the nursing notes.  Pertinent labs & imaging results that were available during my care of the patient were reviewed by me and considered in my medical decision making (see chart for details).     Patient is well-appearing, afebrile, nontoxic, nontachycardic.  No evidence of acute infection on physical exam that warrant initiation of antibiotics.  Suspect viral etiology.  Flu testing was negative in clinic.  COVID testing is pending.  She was encouraged to use conservative treatment measures including Mucinex, Flonase, nasal saline, humidifier to help manage her symptoms.  She was started on Promethazine DM to help with cough.  Discussed that this can be sedating and she is not to drive or drink alcohol while taking it.  She was provided work excuse note with current CDC return to work guidelines based on COVID test result.  If she is positive for COVID, she is not a candidate for antiviral therapy given she is young and otherwise healthy.  Discussed that if her symptoms are proving within a week she is to return for reevaluation.  If she has any worsening or changing symptoms she needs to be seen immediately.  Final Clinical Impressions(s) / UC Diagnoses   Final diagnoses:  Upper respiratory tract infection, unspecified type  Body aches     Discharge Instructions      You tested negative for influenza.  We are testing you for COVID.  We will contact you if you are positive; monitor your MyChart for these results.  Take Promethazine DM for  cough.  This will make you sleepy.  Use Mucinex and Flonase for additional symptom relief.  Alternate Tylenol and ibuprofen for fever and pain.  Make sure that you rest and drink plenty of fluid.  If your symptoms are not improving within a week please return for reevaluation.  If anything worsens you need to be seen immediately.     ED Prescriptions     Medication Sig Dispense Auth. Provider   promethazine-dextromethorphan (PROMETHAZINE-DM) 6.25-15 MG/5ML syrup Take 5 mLs by mouth 3 (three) times daily as needed for cough. 118 mL Atira Borello K, PA-C      PDMP not reviewed this encounter.   Terrilee Croak, PA-C 11/14/22 1120

## 2022-11-14 NOTE — ED Triage Notes (Signed)
Pt reports since Wed had body aches, chills, right ear pain, sore throat, headache. Took Theraflu and Tylenol.

## 2022-11-14 NOTE — Discharge Instructions (Signed)
You tested negative for influenza.  We are testing you for COVID.  We will contact you if you are positive; monitor your MyChart for these results.  Take Promethazine DM for cough.  This will make you sleepy.  Use Mucinex and Flonase for additional symptom relief.  Alternate Tylenol and ibuprofen for fever and pain.  Make sure that you rest and drink plenty of fluid.  If your symptoms are not improving within a week please return for reevaluation.  If anything worsens you need to be seen immediately.

## 2022-11-27 ENCOUNTER — Encounter (HOSPITAL_COMMUNITY): Payer: Self-pay

## 2022-12-12 ENCOUNTER — Encounter (HOSPITAL_COMMUNITY): Payer: Self-pay | Admitting: *Deleted

## 2022-12-12 ENCOUNTER — Ambulatory Visit (HOSPITAL_COMMUNITY)
Admission: EM | Admit: 2022-12-12 | Discharge: 2022-12-12 | Disposition: A | Discharge status: Home / Self Care | Payer: Medicaid Other | Attending: Physician Assistant | Admitting: Physician Assistant

## 2022-12-12 DIAGNOSIS — R0789 Other chest pain: Secondary | ICD-10-CM

## 2022-12-12 DIAGNOSIS — R1010 Upper abdominal pain, unspecified: Secondary | ICD-10-CM

## 2022-12-12 DIAGNOSIS — K219 Gastro-esophageal reflux disease without esophagitis: Secondary | ICD-10-CM

## 2022-12-12 MED ORDER — METHOCARBAMOL 500 MG PO TABS
500.0000 mg | ORAL_TABLET | Freq: Three times a day (TID) | ORAL | 0 refills | Status: DC | PRN
Start: 2022-12-12 — End: 2022-12-19

## 2022-12-12 MED ORDER — PANTOPRAZOLE SODIUM 40 MG PO TBEC: 40.0000 mg | DELAYED_RELEASE_TABLET | Freq: Every day | ORAL | 0 refills | Status: DC

## 2022-12-12 NOTE — ED Provider Notes (Addendum)
[facility]    CSN: [id number] Arrival date & time: 12/12/22  0818      History   Chief Complaint Chief Complaint  Patient presents with   Chest Pain   Medication Refill    HPI Sharon Day is a 27 y.o. female.   Patient here today for evaluation of central chest pain that started yesterday.  She states that area is tender to touch.  She notes that pain started after she had was moving some heavy boxes at work.  She denies any shortness of breath.  She states she tried to take Tylenol but this did not seem helpful.  She notes that lying on her back or her side seem to worsen pain.  She does request refill of her acid reflux medication.  She states that she has had some similar chest pain with acid reflux in the past as well.  The history is provided by the patient.  Chest Pain Associated symptoms: no abdominal pain, no fever, no nausea, no shortness of breath and no vomiting   Medication Refill   Past Medical History:  Diagnosis Date   Abdominal pain    Abscess of left external ear    Acute gastritis without hemorrhage    Chest pain    Chest wall pain    Costochondritis    Dysmenorrhea    Epigastric abdominal pain    Headache    Near syncope    Other iron deficiency anemia    Rash    Shoulder pain    Sore throat    Strep pharyngitis    Tinea corporis     There are no problems to display for this patient.   Past Surgical History:  Procedure Laterality Date   TUBAL LIGATION Bilateral 2020    OB History   No obstetric history on file.      Home Medications    Prior to Admission medications   Medication Sig Start Date End Date Taking? Authorizing Provider  acetaminophen (TYLENOL) 500 MG tablet Take 500 mg by mouth every 6 (six) hours as needed for mild pain.    Yes [provider]  adapalene (DIFFERIN) 0.1 % cream Apply to the face at bedtime twice a week 08/14/22  Yes Ladell Pier, MD  clindamycin (CLEOCIN T) 1 % external  solution Apply topically 2 (two) times daily. 08/18/22  Yes Ladell Pier, MD  methocarbamol (ROBAXIN) 500 MG tablet Take 1 tablet (500 mg total) by mouth every 8 (eight) hours as needed for muscle spasms. 12/12/22   Francene Finders, PA-C  metoCLOPramide (REGLAN) 5 MG tablet Take 1 tablet (5 mg total) by mouth every 8 (eight) hours as needed for nausea. 06/16/22   Jeanell Sparrow, DO  Multiple Vitamin (MULTIVITAMIN) tablet Take 1 tablet by mouth daily.    [provider]  pantoprazole (PROTONIX) 40 MG tablet Take 1 tablet (40 mg total) by mouth daily. 12/12/22   Francene Finders, PA-C  promethazine-dextromethorphan (PROMETHAZINE-DM) 6.25-15 MG/5ML syrup Take 5 mLs by mouth 3 (three) times daily as needed for cough. 11/14/22   Raspet, Derry Skill, PA-C    Family History Family History  Problem Relation Age of Onset   Drug abuse Father     Social History Social History   Tobacco Use   Smoking status: Never   Smokeless tobacco: Never  Vaping Use   Vaping Use: Never used  Substance Use Topics   Alcohol use: Not Currently   Drug use:  Not Currently    Types: Marijuana     Allergies   Patient has no known allergies.   Review of Systems Review of Systems  Constitutional:  Negative for chills and fever.  Eyes:  Negative for discharge and redness.  Respiratory:  Negative for shortness of breath.   Cardiovascular:  Positive for chest pain.  Gastrointestinal:  Negative for abdominal pain, nausea and vomiting.     Physical Exam Triage Vital Signs ED Triage Vitals  Enc Vitals Group     BP 12/12/22 0826 104/68     Pulse Rate 12/12/22 0826 79     Resp 12/12/22 0826 18     Temp 12/12/22 0826 98 F (36.7 C)     Temp Source 12/12/22 0826 Oral     SpO2 12/12/22 0826 97 %     Weight --      Height --      Head Circumference --      Peak Flow --      Pain Score 12/12/22 0824 10     Pain Loc --      Pain Edu? --      Excl. in [place]? --    No data found.  Updated Vital  Signs BP 104/68 (BP Location: Right Arm)   Pulse 79   Temp 98 F (36.7 C) (Oral)   Resp 18   LMP 11/10/2022 (Approximate)   SpO2 97%      Physical Exam Vitals and nursing note reviewed.  Constitutional:      General: She is not in acute distress.    Appearance: Normal appearance. She is not ill-appearing.  HENT:     Head: Normocephalic and atraumatic.     Nose: Nose normal. No congestion or rhinorrhea.  Eyes:     Conjunctiva/sclera: Conjunctivae normal.  Cardiovascular:     Rate and Rhythm: Normal rate and regular rhythm.  Pulmonary:     Effort: Pulmonary effort is normal. No respiratory distress.     Breath sounds: No wheezing, rhonchi or rales.  Chest:     Chest wall: Tenderness (TTP noted to sternal area diffusely) present.  Neurological:     Mental Status: She is alert.  Psychiatric:        Mood and Affect: Mood normal.        Behavior: Behavior normal.        Thought Content: Thought content normal.      UC Treatments / Results  Labs (all labs ordered are listed, but only abnormal results are displayed) Labs Reviewed - No data to display  EKG   Radiology No results found.  Procedures Procedures (including critical care time)  Medications Ordered in UC Medications - No data to display  Initial Impression / Assessment and Plan / UC Course  I have reviewed the triage vital signs and the nursing notes.  Pertinent labs & imaging results that were available during my care of the patient were reviewed by me and considered in my medical decision making (see chart for details).    Suspect MSK cause of chest pain given reproducibility.  Will treat with muscle relaxer.  Protonix also refilled.  Recommended follow-up if no gradual improvement or with any further concerns.  Patient expressed understanding.  Final Clinical Impressions(s) / UC Diagnoses   Final diagnoses:  Chest wall pain  Gastroesophageal reflux disease, unspecified whether esophagitis present    Discharge Instructions   None    ED Prescriptions     Medication Sig Dispense Auth.  Provider   methocarbamol (ROBAXIN) 500 MG tablet Take 1 tablet (500 mg total) by mouth every 8 (eight) hours as needed for muscle spasms. 15 tablet Ewell Poe F, PA-C   pantoprazole (PROTONIX) 40 MG tablet Take 1 tablet (40 mg total) by mouth daily. 30 tablet Francene Finders, PA-C      PDMP not reviewed this encounter.   Francene Finders, PA-C 12/12/22 1235    Francene Finders, PA-C 12/12/22 1236

## 2022-12-12 NOTE — ED Triage Notes (Signed)
Pt states that she started with chest pain yesterday. She was moving some boxes her work sent her home. She did take some tylenol but wasn't able to lay on her back or side.   She states that she is out of her chest burn meds. She was taking protonix.

## 2022-12-18 ENCOUNTER — Emergency Department (HOSPITAL_COMMUNITY): Payer: Medicaid Other

## 2022-12-18 ENCOUNTER — Emergency Department (HOSPITAL_COMMUNITY)
Admission: EM | Admit: 2022-12-18 | Discharge: 2022-12-19 | Disposition: A | Discharge status: Home / Self Care | Payer: Medicaid Other | Attending: Emergency Medicine | Admitting: Emergency Medicine

## 2022-12-18 ENCOUNTER — Other Ambulatory Visit: Payer: Self-pay

## 2022-12-18 DIAGNOSIS — R079 Chest pain, unspecified: Secondary | ICD-10-CM | POA: Diagnosis not present

## 2022-12-18 DIAGNOSIS — R072 Precordial pain: Secondary | ICD-10-CM | POA: Diagnosis not present

## 2022-12-18 NOTE — ED Triage Notes (Signed)
Patient reports left chest pain onset 5 pm this afternoon , no SOB or nausea , mild dizziness.

## 2022-12-18 NOTE — ED Provider Triage Note (Signed)
Emergency Medicine Provider Triage Evaluation Note  Sharon Day , a 27 y.o. female  was evaluated in triage.  Pt complains of chest pain. States symptoms began at 5p. Pain is constant and sharp and left sided. Non radiating. No shortness of breath, palpitations, diaphoresis, nausea. She states she took her "medicine her doctor gave her for this" which is not helping. She has a history of costochondritis. Denies recent exercise or chest trauma  Review of Systems  Positive: See above Negative:   Physical Exam  BP (!) 129/90 (BP Location: Right Arm)   Pulse 73   Temp (!) 97.5 F (36.4 C) (Oral)   Resp 16   LMP 11/12/2022 (Approximate)   SpO2 100%  Gen:   Awake, no distress   Resp:  Normal effort  MSK:   Moves extremities without difficulty  Other:  TTP of the left chest wall. S1/S2 without murmur  Medical Decision Making  Medically screening exam initiated at 8:54 PM.  Appropriate orders placed.  Zenola Vroman was informed that the remainder of the evaluation will be completed by another provider, this initial triage assessment does not replace that evaluation, and the importance of remaining in the ED until their evaluation is complete.     Mickie Hillier, PA-C 12/18/22 [id number]

## 2022-12-19 ENCOUNTER — Ambulatory Visit: Payer: Self-pay

## 2022-12-19 ENCOUNTER — Encounter (HOSPITAL_COMMUNITY): Payer: Self-pay | Admitting: Emergency Medicine

## 2022-12-19 LAB — BASIC METABOLIC PANEL
Anion gap: 8 (ref 5–15)
BUN: 6 mg/dL (ref 6–20)
CO2: 23 mmol/L (ref 22–32)
Calcium: 8.6 mg/dL — ABNORMAL LOW (ref 8.9–10.3)
Chloride: 107 mmol/L (ref 98–111)
Creatinine, Ser: 0.84 mg/dL (ref 0.44–1.00)
GFR, Estimated: >60 mL/min (ref >60)
Glucose, Bld: 95 mg/dL (ref 70–99)
Potassium: 3.2 mmol/L — ABNORMAL LOW (ref 3.5–5.1)
Sodium: 138 mmol/L (ref 135–145)

## 2022-12-19 LAB — CBC WITH DIFFERENTIAL/PLATELET
Abs Immature Granulocytes: 0.01 10*3/uL (ref 0.00–0.07)
Basophils Absolute: 0 10*3/uL (ref 0.0–0.1)
Basophils Relative: 1 %
Eosinophils Absolute: 0.1 10*3/uL (ref 0.0–0.5)
Eosinophils Relative: 2 %
HCT: 35.8 % — ABNORMAL LOW (ref 36.0–46.0)
Hemoglobin: 12 g/dL (ref 12.0–15.0)
Immature Granulocytes: 0 %
Lymphocytes Relative: 49 %
Lymphs Abs: 2.6 10*3/uL (ref 0.7–4.0)
MCH: 30.1 pg (ref 26.0–34.0)
MCHC: 33.5 g/dL (ref 30.0–36.0)
MCV: 89.7 fL (ref 80.0–100.0)
Monocytes Absolute: 0.5 10*3/uL (ref 0.1–1.0)
Monocytes Relative: 9 %
Neutro Abs: 2.1 10*3/uL (ref 1.7–7.7)
Neutrophils Relative %: 39 %
Platelets: 207 10*3/uL (ref 150–400)
RBC: 3.99 MIL/uL (ref 3.87–5.11)
RDW: 13.3 % (ref 11.5–15.5)
WBC: 5.3 10*3/uL (ref 4.0–10.5)
nRBC: 0 % (ref 0.0–0.2)

## 2022-12-19 LAB — TROPONIN I (HIGH SENSITIVITY): Troponin I (High Sensitivity): <2 ng/L (ref <18)

## 2022-12-19 MED ORDER — METHOCARBAMOL 500 MG PO TABS
1000.0000 mg | ORAL_TABLET | Freq: Once | ORAL | Status: AC
  Administered 2022-12-19: 1000 mg via ORAL
  Filled 2022-12-19: qty 2

## 2022-12-19 MED ORDER — METHOCARBAMOL 500 MG PO TABS: 500.0000 mg | ORAL_TABLET | Freq: Two times a day (BID) | ORAL | 0 refills | Status: DC

## 2022-12-19 NOTE — Telephone Encounter (Signed)
      Chief Complaint: Chest pain, seen in ED yesterday. Feels worse today. Going back to ED. Asking for follow up sooner than 01/08/23. Symptoms: Pain 7/10 Frequency:  Yesterday Pertinent Negatives: Patient denies  Disposition: [x]$ ED /[]$ Urgent Care (no appt availability in office) / []$ Appointment(In office/virtual)/ []$  [facility]/ []$ Home Care/ []$ Refused Recommended Disposition /[]$ [facility]/ []$  Follow-up with PCP Additional Notes: Please advise pt.   Reason for Disposition  [1] Chest pain (or "angina") comes and goes AND [2] is happening more often (increasing in frequency) or getting worse (increasing in severity)  (Exception: Chest pains that last only a few seconds.)  Answer Assessment - Initial Assessment Questions 1. LOCATION: "Where does it hurt?"       Middle, left 2. RADIATION: "Does the pain go anywhere else?" (e.g., into neck, jaw, arms, back)     No 3. ONSET: "When did the chest pain begin?" (Minutes, hours or days)      Seen in ED yesterday. Started again this morning 4. PATTERN: "Does the pain come and go, or has it been constant since it started?"  "Does it get worse with exertion?"      Constant this morning 5. DURATION: "How long does it last" (e.g., seconds, minutes, hours)     Hours 6. SEVERITY: "How bad is the pain?"  (e.g., Scale 1-10; mild, moderate, or severe)    - MILD (1-3): doesn't interfere with normal activities     - MODERATE (4-7): interferes with normal activities or awakens from sleep    - SEVERE (8-10): excruciating pain, unable to do any normal activities       Now - 9 7. CARDIAC RISK FACTORS: "Do you have any history of heart problems or risk factors for heart disease?" (e.g., angina, prior heart attack; diabetes, high blood pressure, high cholesterol, smoker, or strong family history of heart disease)     No 8. PULMONARY RISK FACTORS: "Do you have any history of lung disease?"  (e.g., blood clots in lung, asthma, emphysema,  birth control pills)     No 9. CAUSE: "What do you think is causing the chest pain?"     Unsure 10. OTHER SYMPTOMS: "Do you have any other symptoms?" (e.g., dizziness, nausea, vomiting, sweating, fever, difficulty breathing, cough)       Sharp pain 11. PREGNANCY: "Is there any chance you are pregnant?" "When was your last menstrual period?"       No  Protocols used: Chest Pain-A-AH

## 2022-12-19 NOTE — ED Provider Notes (Signed)
[facility] Provider Note   CSN: [id number] Arrival date & time: 12/18/22  2021     History  Chief Complaint  Patient presents with   Chest Pain    Sharon Day is a 27 y.o. female.  HPI   Patient without significant medical history presenting with complaints of left-sided chest pain.  Patient states that chest pain has been going on for about a years time but starting yesterday pain got a lot worse, she states pain is mainly in her left chest, does not radiate, no associated shortness of breath pleuritic chest pain no bloody sputum, she denies pain rating to her back, no leg swelling no calf tenderness she has no history of PEs or DVTs currently not on oral birth control, no recent surgeries long immobilizations, she has no cardiac risk factors i.e. no hypertension, no hyperlipidemia, does not smoke, no family history of this.  States that she has been worked up by cardiology without explanation  Reviewed patient's chart seen by cardiology, note that it was atypical, they recommend echo as well as CT cardiac imaging but patient has not presented for these workups.  Home Medications Prior to Admission medications   Medication Sig Start Date End Date Taking? Authorizing Provider  methocarbamol (ROBAXIN) 500 MG tablet Take 1 tablet (500 mg total) by mouth 2 (two) times daily. 12/19/22  Yes Marcello Fennel, PA-C  acetaminophen (TYLENOL) 500 MG tablet Take 500 mg by mouth every 6 (six) hours as needed for mild pain.     [provider]  adapalene (DIFFERIN) 0.1 % cream Apply to the face at bedtime twice a week 08/14/22   Ladell Pier, MD  clindamycin (CLEOCIN T) 1 % external solution Apply topically 2 (two) times daily. 08/18/22   Ladell Pier, MD  metoCLOPramide (REGLAN) 5 MG tablet Take 1 tablet (5 mg total) by mouth every 8 (eight) hours as needed for nausea. 06/16/22   Jeanell Sparrow, DO  Multiple Vitamin (MULTIVITAMIN)  tablet Take 1 tablet by mouth daily.    [provider]  pantoprazole (PROTONIX) 40 MG tablet Take 1 tablet (40 mg total) by mouth daily. 12/12/22   Francene Finders, PA-C  promethazine-dextromethorphan (PROMETHAZINE-DM) 6.25-15 MG/5ML syrup Take 5 mLs by mouth 3 (three) times daily as needed for cough. 11/14/22   Raspet, Derry Skill, PA-C      Allergies    Patient has no known allergies.    Review of Systems   Review of Systems  Constitutional:  Negative for chills and fever.  Respiratory:  Negative for shortness of breath.   Cardiovascular:  Positive for chest pain.  Gastrointestinal:  Negative for abdominal pain.  Neurological:  Negative for headaches.    Physical Exam Updated Vital Signs BP 112/87   Pulse 87   Temp 97.6 F (36.4 C) (Oral)   Resp 18   Ht 5' 3"$  (1.6 m)   Wt 49.9 kg   LMP 11/12/2022 (Approximate)   SpO2 98%   BMI 19.49 kg/m  Physical Exam Vitals and nursing note reviewed.  Constitutional:      General: She is not in acute distress.    Appearance: She is not ill-appearing.  HENT:     Head: Normocephalic and atraumatic.     Nose: No congestion.  Eyes:     Conjunctiva/sclera: Conjunctivae normal.  Cardiovascular:     Rate and Rhythm: Normal rate and regular rhythm.     Pulses: Normal pulses.  Heart sounds: No murmur heard.    No friction rub. No gallop.  Pulmonary:     Effort: No respiratory distress.     Breath sounds: No wheezing, rhonchi or rales.     Comments: No evidence of respiratory distress nontachypneic nonhypoxic, chest pain was reproducible left anterior ribs along 4 5. Abdominal:     Palpations: Abdomen is soft.     Tenderness: There is no abdominal tenderness. There is no right CVA tenderness or left CVA tenderness.  Musculoskeletal:     Right lower leg: No edema.     Left lower leg: No edema.     Comments: No unilateral leg swelling no calf tenderness no palpable cords.  Skin:    General: Skin is warm and dry.  Neurological:      Mental Status: She is alert.  Psychiatric:        Mood and Affect: Mood normal.     ED Results / Procedures / Treatments   Labs (all labs ordered are listed, but only abnormal results are displayed) Labs Reviewed  BASIC METABOLIC PANEL - Abnormal; Notable for the following components:      Result Value   Potassium 3.2 (*)    Calcium 8.6 (*)    All other components within normal limits  CBC WITH DIFFERENTIAL/PLATELET - Abnormal; Notable for the following components:   HCT 35.8 (*)    All other components within normal limits  TROPONIN I (HIGH SENSITIVITY)  TROPONIN I (HIGH SENSITIVITY)    EKG None  Radiology DG Chest 1 View  Result Date: 12/18/2022 CLINICAL DATA:  Chest pain EXAM: CHEST  1 VIEW COMPARISON:  Chest x-ray 07/13/2022 FINDINGS: The heart size and mediastinal contours are within normal limits. Both lungs are clear. The visualized skeletal structures are unremarkable. IMPRESSION: No active disease. Electronically Signed   By: Ronney Asters M.D.   On: 12/18/2022 21:08    Procedures Procedures    Medications Ordered in ED Medications  methocarbamol (ROBAXIN) tablet 1,000 mg (1,000 mg Oral Given 12/19/22 0158)    ED Course/ Medical Decision Making/ A&P                             Medical Decision Making Amount and/or Complexity of Data Reviewed Labs: ordered.  Risk Prescription drug management.   This patient presents to the ED for concern of chest pain, this involves an extensive number of treatment options, and is a complaint that carries with it a high risk of complications and morbidity.  The differential diagnosis includes PE, ACS, costochondritis, pneumonia    Additional history obtained:  Additional history obtained from N/A External records from outside source obtained and reviewed including cardiology notes   Co morbidities that complicate the patient evaluation  N/A  Social Determinants of Health:  N/A    Lab Tests:  I  Ordered, and personally interpreted labs.  The pertinent results include: CBC unremarkable, BMP shows potassium 3.2, negative first troponin   Imaging Studies ordered:  I ordered imaging studies including chest x-ray I independently visualized and interpreted imaging which showed negative acute findings I agree with the radiologist interpretation   Cardiac Monitoring:  The patient was maintained on a cardiac monitor.  I personally viewed and interpreted the cardiac monitored which showed an underlying rhythm of: Without signs of ischemia   Medicines ordered and prescription drug management:  I ordered medication including muscle relaxer I have reviewed the patients home medicines  and have made adjustments as needed  Critical Interventions:  N/A   Reevaluation:  Presents with chest pain, she had a benign physical exam, I suspect likely muscular in nature, will obtain basic lab work imaging and reassess.  Patient is reassessed states her pain is completely resolved, she has no complaints, she is agreement discharge at this time.    Consultations Obtained:  N/a    Test Considered:  N/a    Rule out I have low suspicion for ACS as history is atypical, patient has no cardiac history, EKG was sinus rhythm without signs of ischemia, patient first troponin was negative, will defer on second troponin patient did not endorsing chest pain for greater then 8 hours, I would have expected elevation in troponins if ischemia was present.  Low suspicion for PE as patient denies pleuritic chest pain, shortness of breath, patient denies leg pain, no pedal edema noted on exam, patient was PERC negative.  Low suspicion for AAA or aortic dissection as history is atypical, patient has low risk factors.  Low suspicion for systemic infection as patient is nontoxic-appearing, vital signs reassuring, no obvious source infection noted on exam.     Dispostion and problem list  After  consideration of the diagnostic results and the patients response to treatment, I feel that the patent would benefit from discharge.  Precordial chest pain-will have patient follow-up with her cardiologist for further evaluation as she has not gone for her cardiac CT imaging and/or her echo.  Will provide her with Robaxin as I suspect this is more muscular nature.            Final Clinical Impression(s) / ED Diagnoses Final diagnoses:  Precordial chest pain    Rx / DC Orders ED Discharge Orders          Ordered    methocarbamol (ROBAXIN) 500 MG tablet  2 times daily        12/19/22 0324              Marcello Fennel, PA-C 12/19/22 0324    Maudie Flakes, MD 12/19/22 [phone]

## 2022-12-19 NOTE — Discharge Instructions (Signed)
Your lab work was reassuring, I have given you a muscle relaxer please take as prescribed.  This medication make you drowsy do not consume alcohol or operate heavy machinery while taking this medication.  Please follow back up with your cardiologist.  Come back to the emergency department if you develop chest pain, shortness of breath, severe abdominal pain, uncontrolled nausea, vomiting, diarrhea.

## 2022-12-22 NOTE — Telephone Encounter (Signed)
Noted  

## 2022-12-23 NOTE — Telephone Encounter (Signed)
Spoke with patient. Patient voices she will keep the appointment she already has.

## 2022-12-25 ENCOUNTER — Ambulatory Visit (INDEPENDENT_AMBULATORY_CARE_PROVIDER_SITE_OTHER): Payer: Medicaid Other | Admitting: Primary Care

## 2022-12-25 ENCOUNTER — Encounter (HOSPITAL_COMMUNITY): Payer: Self-pay

## 2022-12-26 ENCOUNTER — Ambulatory Visit (HOSPITAL_COMMUNITY)
Admission: RE | Admit: 2022-12-26 | Discharge: 2022-12-26 | Disposition: A | Discharge status: Home / Self Care | Payer: Medicaid Other | Source: Ambulatory Visit | Attending: Cardiovascular Disease | Admitting: Cardiovascular Disease

## 2022-12-26 DIAGNOSIS — R072 Precordial pain: Secondary | ICD-10-CM | POA: Diagnosis not present

## 2022-12-26 MED ORDER — NITROGLYCERIN 0.4 MG SL SUBL: 0.8000 mg | SUBLINGUAL_TABLET | Freq: Once | SUBLINGUAL | Status: AC

## 2022-12-26 MED ORDER — IOHEXOL 350 MG/ML SOLN
80.0000 mL | Freq: Once | INTRAVENOUS | Status: AC | PRN
  Administered 2022-12-26: 80 mL via INTRAVENOUS

## 2022-12-26 MED ORDER — NITROGLYCERIN 0.4 MG SL SUBL
SUBLINGUAL_TABLET | SUBLINGUAL | Status: AC
  Administered 2022-12-26: 0.8 mg via SUBLINGUAL
  Filled 2022-12-26: qty 2

## 2023-01-08 ENCOUNTER — Ambulatory Visit: Payer: Medicaid Other | Attending: Physician Assistant | Admitting: Physician Assistant

## 2023-01-08 ENCOUNTER — Encounter: Payer: Self-pay | Admitting: Physician Assistant

## 2023-01-08 VITALS — BP 109/69 | HR 68 | Wt 104.6 lb

## 2023-01-08 DIAGNOSIS — Z09 Encounter for follow-up examination after completed treatment for conditions other than malignant neoplasm: Secondary | ICD-10-CM

## 2023-01-08 DIAGNOSIS — R079 Chest pain, unspecified: Secondary | ICD-10-CM | POA: Diagnosis not present

## 2023-01-08 DIAGNOSIS — R55 Syncope and collapse: Secondary | ICD-10-CM

## 2023-01-08 DIAGNOSIS — E876 Hypokalemia: Secondary | ICD-10-CM | POA: Diagnosis not present

## 2023-01-08 NOTE — Patient Instructions (Addendum)
Drink 80-100 ounces water daily.  Do not skip meals   Syncope, Adult  Syncope is when you pass out or faint for a short time. It is caused by a sudden decrease in blood flow to the brain. This can happen for many reasons. It can sometimes happen when seeing blood, getting a shot (injection), or having pain or strong emotions. Most causes of fainting are not dangerous, but in some cases it can be a sign of a serious medical problem. If you faint, get help right away. Call your local emergency services (911 in the [place] Follow these instructions at home: Watch for any changes in your symptoms. Take these actions to stay safe and help with your symptoms: Knowing when you may be about to faint Signs that you may be about to faint include: Feeling dizzy or light-headed. It may feel like the room is spinning. Feeling weak. Feeling like you may vomit (nauseous). Seeing spots or seeing all white or all black. Having cold, clammy skin. Feeling warm and sweaty. Hearing ringing in the ears. If you start to feel like you might faint, sit or lie down right away. If sitting, lower your head down between your legs. If lying down, raise (elevate) your feet above the level of your heart. Breathe deeply and steadily. Wait until all of the symptoms are gone. Have someone stay with you until you feel better. Medicines Take over-the-counter and prescription medicines only as told by your doctor. If you are taking blood pressure or heart medicine, sit up and stand up slowly. Spend a few minutes getting ready to sit and then stand. This can help you feel less dizzy. Lifestyle Do not drive, use machinery, or play sports until your doctor says it is okay. Do not drink alcohol. Do not smoke or use any products that contain nicotine or tobacco. If you need help quitting, ask your doctor. Avoid hot tubs and saunas. General instructions Talk with your doctor about your symptoms. You may need to have testing to  help find the cause. Drink enough fluid to keep your pee (urine) pale yellow. Avoid standing for a long time. If you must stand for a long time, do movements such as: Moving your legs. Crossing your legs. Flexing and stretching your leg muscles. Squatting. Keep all follow-up visits. Contact a doctor if: You have episodes of near fainting. Get help right away if: You pass out or faint. You hit your head or are injured after fainting. You have any of these symptoms: Fast or uneven heartbeats (palpitations). Pain in your chest, belly, or back. Shortness of breath. You have jerky movements that you cannot control (seizure). You have a very bad headache. You are confused. You have problems with how you see (vision). You are very weak. You have trouble walking. You are bleeding from your mouth or your butt (rectum). You have black or tarry poop (stool). These symptoms may be an emergency. Get help right away. Call your local emergency services (911 in the [place] Do not wait to see if the symptoms will go away. Do not drive yourself to the hospital. Summary Syncope is when you pass out or faint for a short time. It is caused by a sudden decrease in blood flow to the brain. Signs that you may be about to faint include feeling dizzy or light-headed, feeling like you may vomit, seeing all white or all black, or having cold, clammy skin. If you start to feel like you might faint, sit or  lie down right away. Lower your head if sitting, or raise (elevate) your feet if lying down. Breathe deeply and steadily. Wait until all of the symptoms are gone. This information is not intended to replace advice given to you by your health care provider. Make sure you discuss any questions you have with your health care provider. Document Revised: 02/28/2021 Document Reviewed: 02/28/2021 Elsevier Patient Education  [place].

## 2023-01-08 NOTE — Progress Notes (Signed)
Patient ID: Sharon Day, female   DOB: 1996/05/20, 27 y.o.   MRN: [id number]    Nancyjo Seepersad, is a 27 y.o. female  [id number]  [id number]  DOB - 07/05/1996  Chief Complaint  Patient presents with   Hospitalization Follow-up   Medication Refill       Subjective:   Sharon Day is a 27 y.o. female here today for After ED visit 12/18/2022 for CP.  The CP is intermittent.  Pain usus sharp and fleeting but can last all day.  She did go and have the coronary CTA which was essentially normal.  She has not seen cardiology yet.  No FH early sudden death.  She also tells me about an episode yesterday where she was cleaning the bathroom and stood up too quickly and "blacked out" and hit her head on the side of the toilet.  She admits to poor food and liquid intake.  No N/V and she felt fine the rest of the day. No further episodes  From ED note: Patient without significant medical history presenting with complaints of left-sided chest pain.  Patient states that chest pain has been going on for about a years time but starting yesterday pain got a lot worse, she states pain is mainly in her left chest, does not radiate, no associated shortness of breath pleuritic chest pain no bloody sputum, she denies pain rating to her back, no leg swelling no calf tenderness she has no history of PEs or DVTs currently not on oral birth control, no recent surgeries long immobilizations, she has no cardiac risk factors i.e. no hypertension, no hyperlipidemia, does not smoke, no family history of this.  States that she has been worked up by cardiology without explanation   Reviewed patient's chart seen by cardiology, note that it was atypical, they recommend echo as well as CT cardiac imaging but patient has not presented for these workups.   After consideration of the diagnostic results and the patients response to treatment, I feel that the patent would benefit from discharge.   Precordial chest pain-will have  patient follow-up with her cardiologist for further evaluation as she has not gone for her cardiac CT imaging and/or her echo.  Will provide her with Robaxin as I suspect this is more muscular nature  No problems updated.  ALLERGIES: No Known Allergies  PAST MEDICAL HISTORY: Past Medical History:  Diagnosis Date   Abdominal pain    Abscess of left external ear    Acute gastritis without hemorrhage    Chest pain    Chest wall pain    Costochondritis    Dysmenorrhea    Epigastric abdominal pain    Headache    Near syncope    Other iron deficiency anemia    Rash    Shoulder pain    Sore throat    Strep pharyngitis    Tinea corporis     MEDICATIONS AT HOME: Prior to Admission medications   Medication Sig Start Date End Date Taking? Authorizing Provider  methocarbamol (ROBAXIN) 500 MG tablet Take 1 tablet (500 mg total) by mouth 2 (two) times daily. 12/19/22  Yes Marcello Fennel, PA-C  Multiple Vitamin (MULTIVITAMIN) tablet Take 1 tablet by mouth daily.   Yes [provider]  acetaminophen (TYLENOL) 500 MG tablet Take 500 mg by mouth every 6 (six) hours as needed for mild pain.  Patient not taking: Reported on 01/08/2023    [provider]  adapalene (DIFFERIN) 0.1 % cream Apply to  the face at bedtime twice a week Patient not taking: Reported on 01/08/2023 08/14/22   Ladell Pier, MD  clindamycin (CLEOCIN T) 1 % external solution Apply topically 2 (two) times daily. Patient not taking: Reported on 01/08/2023 08/18/22   Ladell Pier, MD  metoCLOPramide (REGLAN) 5 MG tablet Take 1 tablet (5 mg total) by mouth every 8 (eight) hours as needed for nausea. Patient not taking: Reported on 01/08/2023 06/16/22   Wynona Dove A, DO  pantoprazole (PROTONIX) 40 MG tablet Take 1 tablet (40 mg total) by mouth daily. Patient not taking: Reported on 01/08/2023 12/12/22   Francene Finders, PA-C  promethazine-dextromethorphan (PROMETHAZINE-DM) 6.25-15 MG/5ML syrup Take 5 mLs  by mouth 3 (three) times daily as needed for cough. Patient not taking: Reported on 01/08/2023 11/14/22   Raspet, Erin K, PA-C    ROS: Neg HEENT Neg resp Neg GI Neg GU Neg MS Neg psych  Objective:   Vitals:   01/08/23 0959  BP: 109/69  Pulse: 68  SpO2: 99%  Weight: 104 lb 9.6 oz (47.4 kg)   Exam General appearance : Awake, alert, not in any distress. Speech Clear. Not toxic looking HEENT: Atraumatic and Normocephalic(no visible contusion where she hit her L forehead yesterday. pupils equally reactive to light and accomodation, EOM intact.  Fundi benign Neck: Supple, no JVD. No cervical lymphadenopathy.  Chest: Good air entry bilaterally, CTAB.  No rales/rhonchi/wheezing CVS: S1 S2 regular, no murmurs.  Extremities: B/L Lower Ext shows no edema, both legs are warm to touch Neurology: Awake alert, and oriented X 3, CN II-XII intact, Non focal.  Finger to nose and heel to shin WNL.  Cerebellar function intact.  Ambulates normally and without assistance Skin: No Rash  Data Review No results found for: "HGBA1C"  Assessment & Plan   1. Vasovagal syncope Likely due to poor hydration and inadequate food intake.  Drink 80-100 ounces water daily and do not skip meals.  It has been more than 24 hours since this occurred and she is feeling good today.  To ED/call 911 if it occurs again and she verbalized understanding - Ambulatory referral to Cardiology - Basic metabolic panel  2. Chest pain in adult Continue cardiology w/up.  Coronary CTA WNL - Ambulatory referral to Cardiology  3. Hypokalemia - Ambulatory referral to Cardiology - Basic metabolic panel  4. Encounter for examination following treatment at hospital No red flags on exam today    Return in about 3 months (around 04/10/2023) for PCP for chronic problems.  The patient was given clear instructions to go to ER or return to medical center if symptoms don't improve, worsen or new problems develop. The patient verbalized  understanding. The patient was told to call to get lab results if they haven't heard anything in the next week.      Freeman Caldron, PA-C [facility], [place]   01/08/2023, 10:26 AM

## 2023-01-09 LAB — BASIC METABOLIC PANEL
BUN/Creatinine Ratio: 9 (ref 9–23)
BUN: 8 mg/dL (ref 6–20)
CO2: 24 mmol/L (ref 20–29)
Calcium: 9.5 mg/dL (ref 8.7–10.2)
Chloride: 102 mmol/L (ref 96–106)
Creatinine, Ser: 0.88 mg/dL (ref 0.57–1.00)
Glucose: 85 mg/dL (ref 70–99)
Potassium: 4 mmol/L (ref 3.5–5.2)
Sodium: 140 mmol/L (ref 134–144)
eGFR: 92 mL/min/{1.73_m2} (ref >59)

## 2023-01-12 DIAGNOSIS — R072 Precordial pain: Secondary | ICD-10-CM | POA: Insufficient documentation

## 2023-01-12 NOTE — Progress Notes (Deleted)
  Cardiology Office Note:    Date:  01/12/2023  ID:  Sharon Day, DOB Dec 31, 1995, MRN [id number] [facility] Providers Cardiologist:  None { Click to update primary MD,subspecialty MD or APP then REFRESH:1}     Patient Profile:   Chest pain  CCTA 12/29/2022: 3 mm right lower lobe pulmonary nodule (likely postinfectious or inflammatory); CAC score 0; no evidence of CAD; patent foramen ovale with left-to-right shunt Patent Foramen Ovale  Anemia      History of Present Illness:   Sharon Day is a 27 y.o. female who returns for evaluation of chest pain, syncope. She originally saw Dr. Angelena Form 09/01/22 for chest pain. CCTA and an echocardiogram were ordered. She was seen in the ED again on 12/18/22 for chest pain. EKG and CXR were normal and hsTrop x 1 was neg. She had the CT done 12/29/22 which showed no CAD and her CAC score is 0. There was a small Patent Foramen Ovale. Dr. Angelena Form reviewed this and did not feel it warranted further management or testing. The echocardiogram has not yet been done. She was seen by primary care 3/7 and noted an episode of syncope. ***  ROS ***    Studies Reviewed:    EKG:  ***  ***  Risk Assessment/Calculations:   {Does this patient have ATRIAL FIBRILLATION?:[phone]} No BP recorded.  {Refresh Note OR Click here to enter BP  :1}***       Physical Exam:   VS:  LMP 11/12/2022 (Approximate)    Wt Readings from Last 3 Encounters:  01/08/23 104 lb 9.6 oz (47.4 kg)  12/19/22 110 lb (49.9 kg)  09/01/22 105 lb 12.8 oz (48 kg)    Physical Exam***    ASSESSMENT AND PLAN:   No problem-specific Assessment & Plan notes found for this encounter. ***    {Are you ordering a CV Procedure (e.g. stress test, cath, DCCV, TEE, etc)?   Press F2        :[id number]}  Dispo:  No follow-ups on file. Signed, Richardson Dopp, PA-C

## 2023-01-13 ENCOUNTER — Ambulatory Visit: Payer: Medicaid Other | Admitting: Physician Assistant

## 2023-01-13 DIAGNOSIS — R072 Precordial pain: Secondary | ICD-10-CM

## 2023-02-17 DIAGNOSIS — H00012 Hordeolum externum right lower eyelid: Secondary | ICD-10-CM | POA: Diagnosis not present

## 2023-02-25 ENCOUNTER — Other Ambulatory Visit: Payer: Self-pay

## 2023-02-25 ENCOUNTER — Encounter (HOSPITAL_COMMUNITY): Payer: Self-pay | Admitting: Emergency Medicine

## 2023-02-25 ENCOUNTER — Ambulatory Visit (HOSPITAL_COMMUNITY)
Admission: EM | Admit: 2023-02-25 | Discharge: 2023-02-25 | Disposition: A | Discharge status: Home / Self Care | Payer: Medicaid Other | Attending: Internal Medicine | Admitting: Internal Medicine

## 2023-02-25 DIAGNOSIS — H6002 Abscess of left external ear: Secondary | ICD-10-CM

## 2023-02-25 MED ORDER — LIDOCAINE HCL 2 % IJ SOLN
INTRAMUSCULAR | Status: AC
  Filled 2023-02-25: qty 20

## 2023-02-25 NOTE — ED Provider Notes (Signed)
[facility]    CSN: [id number] Arrival date & time: 02/25/23  1649      History   Chief Complaint Chief Complaint  Patient presents with   Foreign Body in Ear    HPI Zelena Bushong is a 27 y.o. female presents urgent care today with complaints of swelling to left earlobe x 3 to 4 days.  Patient was concerned she had earring stuck in ear.  Patient reports tender upon touch, denies any recent fever or chills.  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    Past Medical History:  Diagnosis Date   Abdominal pain    Abscess of left external ear    Acute gastritis without hemorrhage    Chest pain    Chest wall pain    Costochondritis    Dysmenorrhea    Epigastric abdominal pain    Headache    Near syncope    Other iron deficiency anemia    Rash    Shoulder pain    Sore throat    Strep pharyngitis    Tinea corporis     Patient Active Problem List   Diagnosis Date Noted   Precordial chest pain 01/12/2023    Past Surgical History:  Procedure Laterality Date   TUBAL LIGATION Bilateral 2020    OB History   No obstetric history on file.      Home Medications    Prior to Admission medications   Medication Sig Start Date End Date Taking? Authorizing Provider  acetaminophen (TYLENOL) 500 MG tablet Take 500 mg by mouth every 6 (six) hours as needed for mild pain.  Patient not taking: Reported on 01/08/2023    [provider]  adapalene (DIFFERIN) 0.1 % cream Apply to the face at bedtime twice a week Patient not taking: Reported on 01/08/2023 08/14/22   Marcine Matar, MD  clindamycin (CLEOCIN T) 1 % external solution Apply topically 2 (two) times daily. Patient not taking: Reported on 01/08/2023 08/18/22   Marcine Matar, MD  methocarbamol (ROBAXIN) 500 MG tablet Take 1 tablet (500 mg total) by mouth 2 (two) times daily. Patient not taking: Reported on 02/25/2023 12/19/22   Carroll Sage, PA-C  metoCLOPramide (REGLAN) 5  MG tablet Take 1 tablet (5 mg total) by mouth every 8 (eight) hours as needed for nausea. Patient not taking: Reported on 01/08/2023 06/16/22   Tanda Rockers A, DO  Multiple Vitamin (MULTIVITAMIN) tablet Take 1 tablet by mouth daily.    [provider]  pantoprazole (PROTONIX) 40 MG tablet Take 1 tablet (40 mg total) by mouth daily. Patient not taking: Reported on 01/08/2023 12/12/22   Tomi Bamberger, PA-C  promethazine-dextromethorphan (PROMETHAZINE-DM) 6.25-15 MG/5ML syrup Take 5 mLs by mouth 3 (three) times daily as needed for cough. Patient not taking: Reported on 01/08/2023 11/14/22   Raspet, Noberto Retort, PA-C    Family History Family History  Problem Relation Age of Onset   Drug abuse Father     Social History Social History   Tobacco Use   Smoking status: Never   Smokeless tobacco: Never  Vaping Use   Vaping Use: Never used  Substance Use Topics   Alcohol use: Not Currently   Drug use: Not Currently    Types: Marijuana     Allergies   Patient has no known allergies.   Review of Systems As stated in HPI otherwise negative   Physical Exam Triage Vital Signs ED Triage Vitals  Enc  Vitals Group     BP 02/25/23 1735 108/77     Pulse Rate 02/25/23 1735 71     Resp 02/25/23 1735 16     Temp 02/25/23 1735 98.3 F (36.8 C)     Temp Source 02/25/23 1735 Oral     SpO2 02/25/23 1735 99 %     Weight --      Height --      Head Circumference --      Peak Flow --      Pain Score 02/25/23 1732 9     Pain Loc --      Pain Edu? --      Excl. in GC? --    No data found.  Updated Vital Signs BP 108/77 (BP Location: Right Arm)   Pulse 71   Temp 98.3 F (36.8 C) (Oral)   Resp 16   LMP 01/26/2023   SpO2 99%   Visual Acuity Right Eye Distance:   Left Eye Distance:   Bilateral Distance:    Right Eye Near:   Left Eye Near:    Bilateral Near:     Physical Exam Constitutional:      General: She is not in acute distress.    Appearance: Normal appearance. She is  not toxic-appearing.  HENT:     Ears:     Comments: Small, pea-sized swelling to left earlobe.  Tender to palpation Neurological:     General: No focal deficit present.     Mental Status: She is alert and oriented to person, place, and time.  Psychiatric:        Mood and Affect: Mood normal.        Behavior: Behavior normal.      UC Treatments / Results  Labs (all labs ordered are listed, but only abnormal results are displayed) Labs Reviewed - No data to display  EKG   Radiology No results found.  Procedures Incision and Drainage  Date/Time: 02/25/2023 7:15 PM  Performed by: Rolla Etienne, NP Authorized by: Rolla Etienne, NP   Consent:    Consent obtained:  Verbal   Consent given by:  Patient   Risks, benefits, and alternatives were discussed: yes     Risks discussed:  Incomplete drainage and pain   Alternatives discussed:  No treatment Universal protocol:    Patient identity confirmed:  Verbally with patient Location:    Type:  Abscess   Size:  1-2cm   Location: Left earlobe. Pre-procedure details:    Skin preparation:  Povidone-iodine Sedation:    Sedation type:  None Anesthesia:    Anesthesia method:  Local infiltration   Local anesthetic:  Lidocaine 1% w/o epi Procedure type:    Complexity:  Simple Procedure details:    Incision types:  Stab incision   Drainage:  Purulent   Drainage amount:  Moderate   Packing materials:  1/4 in gauze Post-procedure details:    Procedure completion:  Tolerated  (including critical care time)  Medications Ordered in UC Medications - No data to display  Initial Impression / Assessment and Plan / UC Course  I have reviewed the triage vital signs and the nursing notes.  Pertinent labs & imaging results that were available during my care of the patient were reviewed by me and considered in my medical decision making (see chart for details).  Abscess, left earlobe -Patient with history of same.  No evidence of  foreign body upon complete drainage of abscess.  No indication for  antibiotic treatment.  Patient instructed to keep area clean and dry.  Avoid using jewelry until completely heals.  Strict follow-up precautions discussed  Reviewed expections re: course of current medical issues. Questions answered. Outlined signs and symptoms indicating need for more acute intervention. Pt verbalized understanding. AVS given  Final Clinical Impressions(s) / UC Diagnoses   Final diagnoses:  Abscess of left earlobe     Discharge Instructions      There was no sign of foreign body in your ear.  It seems the swelling was due to infection which I was able to drain out.  Please avoid using any earrings until area is completely healed.  Keep area clean and dry.  You can use warm compresses for any discomfort.  Please follow-up should swelling recur or you develop any fever or chills.     ED Prescriptions   None    PDMP not reviewed this encounter.   Rolla Etienne, NP 02/25/23 [phone]

## 2023-02-25 NOTE — ED Triage Notes (Signed)
2 days ago noticed a knot in left ear lobe.  Patient states this is not a keloid.  Patient thinks it is an earring back.  Area is painful.  Knot moves within earlobe.  Piercing to earlobe looks intact

## 2023-02-25 NOTE — Discharge Instructions (Signed)
There was no sign of foreign body in your ear.  It seems the swelling was due to infection which I was able to drain out.  Please avoid using any earrings until area is completely healed.  Keep area clean and dry.  You can use warm compresses for any discomfort.  Please follow-up should swelling recur or you develop any fever or chills.

## 2023-02-26 NOTE — Progress Notes (Deleted)
  Cardiology Office Note:    Date:  02/26/2023  ID:  Ulyses Amor, DOB Jul 25, 1996, MRN [id number] PCP: Marcine Matar, MD  [facility] Providers Cardiologist:  None { Click to update primary MD,subspecialty MD or APP then REFRESH:1}    {Click to Open Review  :1}   Patient Profile:   Chest pain  CCTA 12/29/2022: 3 mm right lower lobe pulmonary nodule; CAC score 0, no CAD, + PFO with left-to-right shunt Patent Foramen Ovale     History of Present Illness:   Sharon Day is a 27 y.o. female who returns for evaluation of syncope. She was evaluated by Dr. Clifton James for chest pain in 08/2022. CCTA demonstrated a CAC score of 0 and no CAD. An echocardiogram was also ordered. But this has not yet been done. She was seen in the ED for chest pain on 12/18/22. Her hsTrop was neg. She was seen by primary care on 01/08/23 for an episode of syncope. ***  ROS ***    Studies Reviewed:    EKG:  ***  *** Risk Assessment/Calculations:   {Does this patient have ATRIAL FIBRILLATION?:[phone]}         Physical Exam:   VS:  LMP 01/26/2023    Wt Readings from Last 3 Encounters:  01/08/23 104 lb 9.6 oz (47.4 kg)  12/19/22 110 lb (49.9 kg)  09/01/22 105 lb 12.8 oz (48 kg)    Physical Exam***    ASSESSMENT AND PLAN:   No problem-specific Assessment & Plan notes found for this encounter. ***    {Are you ordering a CV Procedure (e.g. stress test, cath, DCCV, TEE, etc)?   Press F2        :[id number]}  Dispo:  No follow-ups on file.  Signed, Tereso Newcomer, PA-C

## 2023-02-27 ENCOUNTER — Ambulatory Visit: Payer: Medicaid Other | Attending: Physician Assistant | Admitting: Physician Assistant

## 2023-02-27 DIAGNOSIS — R072 Precordial pain: Secondary | ICD-10-CM

## 2023-04-10 ENCOUNTER — Other Ambulatory Visit: Payer: Self-pay

## 2023-04-10 ENCOUNTER — Ambulatory Visit: Payer: Medicaid Other | Attending: Internal Medicine | Admitting: Internal Medicine

## 2023-04-10 ENCOUNTER — Encounter: Payer: Self-pay | Admitting: Internal Medicine

## 2023-04-10 VITALS — BP 103/66 | HR 76 | Temp 98.5°F | Ht 63.0 in | Wt 108.0 lb

## 2023-04-10 DIAGNOSIS — R079 Chest pain, unspecified: Secondary | ICD-10-CM | POA: Insufficient documentation

## 2023-04-10 DIAGNOSIS — Q2112 Patent foramen ovale: Secondary | ICD-10-CM | POA: Diagnosis not present

## 2023-04-10 DIAGNOSIS — Z79899 Other long term (current) drug therapy: Secondary | ICD-10-CM | POA: Diagnosis not present

## 2023-04-10 MED ORDER — IBUPROFEN 600 MG PO TABS: 600.0000 mg | ORAL_TABLET | Freq: Three times a day (TID) | ORAL | 0 refills | Status: AC | PRN

## 2023-04-10 MED ORDER — METHOCARBAMOL 500 MG PO TABS: 500.0000 mg | ORAL_TABLET | Freq: Two times a day (BID) | ORAL | 1 refills | Status: DC | PRN

## 2023-04-10 NOTE — Progress Notes (Signed)
Patient ID: Sharon Day, female    DOB: May 07, 1996  MRN: [id number]  CC: Follow-up (Follow up. Jonni Sanger pain since yesterday)   Subjective: Sharon Day is a 27 y.o. female who presents for eval CP Her concerns today include:   Pt was seen by me in October of last year.  At that time she complained of intermittent chest pains described as sharp and stabbing and can last up to 1 hour.  Chest pain was associated with rest or exertion.  The pain did not radiate and she was not having any shortness of breath.  She had no family history of heart disease and does not smoke.  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  Had been seen in the emergency room.  I referred her to cardiology.  She saw Dr. Clifton James in October.  She ordered an echo and coronary CT. coronary CT revealed normal coronaries with calcium score of 0.  Incidental finding of patent foramen ovale.  Today: She complains of soreness in the center of her chest since yesterday.  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  Yesterday she did a lot of pushing at work and it is made her chest sore.  There is no radiation of the pain.  Pain is not worse when she takes a deep breath.  No recent long distance travel.  She took some Tylenol which helped.  Requesting a refill on muscle relaxant which she states helped in the past.   Patient Active Problem List   Diagnosis Date Noted   Precordial chest pain 01/12/2023     Current Outpatient Medications on File Prior to Visit  Medication Sig Dispense Refill   acetaminophen (TYLENOL) 500 MG tablet Take 500 mg by mouth every 6 (six) hours as needed for mild pain.     adapalene (DIFFERIN) 0.1 % cream Apply to the face at bedtime twice a week 45 g 0   methocarbamol (ROBAXIN) 500 MG tablet Take 1 tablet (500 mg total) by mouth 2 (two) times daily. 20 tablet 0   Multiple Vitamin (MULTIVITAMIN) tablet Take 1 tablet by mouth daily.     clindamycin  (CLEOCIN T) 1 % external solution Apply topically 2 (two) times daily. (Patient not taking: Reported on 01/08/2023) 30 mL 0   pantoprazole (PROTONIX) 40 MG tablet Take 1 tablet (40 mg total) by mouth daily. (Patient not taking: Reported on 01/08/2023) 30 tablet 0   No current facility-administered medications on file prior to visit.    No Known Allergies  Social History   Socioeconomic History   Marital status: Significant Other    Spouse name: Not on file   Number of children: 3   Years of education: Not on file   Highest education level: Not on file  Occupational History   Occupation: Museum/gallery exhibitions officer  Tobacco Use   Smoking status: Never   Smokeless tobacco: Never  Vaping Use   Vaping Use: Never used  Substance and Sexual Activity   Alcohol use: Not Currently   Drug use: Not Currently    Types: Marijuana   Sexual activity: Yes    Birth control/protection: None  Other Topics Concern   Not on file  Social History Narrative   Not on file   Social Determinants of Health   Financial Resource Strain: Low Risk  (08/22/2022)   Overall Financial Resource Strain (CARDIA)    Difficulty of Paying Living Expenses:  Not hard at all  Food Insecurity: No Food Insecurity (08/22/2022)   Hunger Vital Sign    Worried About Running Out of Food in the Last Year: Never true    Ran Out of Food in the Last Year: Never true  Transportation Needs: No Transportation Needs (08/07/2022)   PRAPARE - Administrator, Civil Service (Medical): No    Lack of Transportation (Non-Medical): No  Physical Activity: Not on file  Stress: Not on file  Social Connections: Not on file  Intimate Partner Violence: Not on file    Family History  Problem Relation Age of Onset   Drug abuse Father     Past Surgical History:  Procedure Laterality Date   TUBAL LIGATION Bilateral 2020    ROS: Review of Systems Negative except as stated above  PHYSICAL EXAM: BP 103/66 (BP Location: Left Arm,  Patient Position: Sitting, Cuff Size: Small)   Pulse 76   Temp 98.5 F (36.9 C) (Oral)   Ht 5\' 3"  (1.6 m)   Wt 108 lb (49 kg)   SpO2 100%   BMI 19.13 kg/m   Wt Readings from Last 3 Encounters:  04/10/23 108 lb (49 kg)  01/08/23 104 lb 9.6 oz (47.4 kg)  12/19/22 110 lb (49.9 kg)    Physical Exam  General appearance - alert, well appearing, and in no distress Mental status - normal mood, behavior, speech, dress, motor activity, and thought processes Chest - clear to auscultation, no wheezes, rales or rhonchi, symmetric air entry Heart -regular rate rhythm, no gallops, murmurs or friction rubs.  No reproducible tenderness on palpation of the chest wall.  EKG: Sinus rhythm with sinus arrhythmia otherwise normal.     Latest Ref Rng & Units 01/08/2023   10:30 AM 12/19/2022    1:58 AM 08/14/2022   10:55 AM  CMP  Glucose 70 - 99 mg/dL 85  95    BUN 6 - 20 mg/dL 8  6    Creatinine 1.61 - 1.00 mg/dL 0.96  0.45    Sodium 409 - 144 mmol/L 140  138    Potassium 3.5 - 5.2 mmol/L 4.0  3.2    Chloride 96 - 106 mmol/L 102  107    CO2 20 - 29 mmol/L 24  23    Calcium 8.7 - 10.2 mg/dL 9.5  8.6    Total Protein 6.0 - 8.5 g/dL   7.0   Total Bilirubin 0.0 - 1.2 mg/dL   0.6   Alkaline Phos 44 - 121 IU/L   49   AST 0 - 40 IU/L   14   ALT 0 - 32 IU/L   10    Lipid Panel  No results found for: "CHOL", "TRIG", "HDL", "CHOLHDL", "VLDL", "LDLCALC", "LDLDIRECT"  CBC    Component Value Date/Time   WBC 5.3 12/19/2022 0158   RBC 3.99 12/19/2022 0158   HGB 12.0 12/19/2022 0158   HCT 35.8 (L) 12/19/2022 0158   PLT 207 12/19/2022 0158   MCV 89.7 12/19/2022 0158   MCH 30.1 12/19/2022 0158   MCHC 33.5 12/19/2022 0158   RDW 13.3 12/19/2022 0158   LYMPHSABS 2.6 12/19/2022 0158   MONOABS 0.5 12/19/2022 0158   EOSABS 0.1 12/19/2022 0158   BASOSABS 0.0 12/19/2022 0158    ASSESSMENT AND PLAN:  1. Chest pain in adult Atypical and most likely musculoskeletal in nature.  I have given some Robaxin  and ibuprofen to use as needed. - methocarbamol (ROBAXIN) 500 MG tablet;  Take 1 tablet (500 mg total) by mouth 2 (two) times daily as needed for muscle spasms.  Dispense: 20 tablet; Refill: 1 - ibuprofen (ADVIL) 600 MG tablet; Take 1 tablet (600 mg total) by mouth every 8 (eight) hours as needed. Take with food.  Dispense: 30 tablet; Refill: 0    Patient was given the opportunity to ask questions.  Patient verbalized understanding of the plan and was able to repeat key elements of the plan.   This documentation was completed using Paediatric nurse.  Any transcriptional errors are unintentional.  No orders of the defined types were placed in this encounter.    Requested Prescriptions    No prescriptions requested or ordered in this encounter    No follow-ups on file.  Jonah Blue, MD, FACP

## 2023-04-22 ENCOUNTER — Encounter (HOSPITAL_COMMUNITY): Payer: Self-pay | Admitting: *Deleted

## 2023-04-22 ENCOUNTER — Ambulatory Visit (HOSPITAL_COMMUNITY)
Admission: EM | Admit: 2023-04-22 | Discharge: 2023-04-22 | Disposition: A | Discharge status: Home / Self Care | Payer: Medicaid Other | Attending: Emergency Medicine | Admitting: Emergency Medicine

## 2023-04-22 DIAGNOSIS — R0789 Other chest pain: Secondary | ICD-10-CM

## 2023-04-22 MED ORDER — METHOCARBAMOL 500 MG PO TABS: 500.0000 mg | ORAL_TABLET | Freq: Two times a day (BID) | ORAL | 0 refills | Status: AC | PRN

## 2023-04-22 NOTE — ED Provider Notes (Signed)
[facility]    CSN: [id number] Arrival date & time: 04/22/23  0840     History   Chief Complaint Chief Complaint  Patient presents with   Chest Pain    HPI Sharon Day is a 27 y.o. female.  Here with several month history of intermittent chest wall pain Worse with movement, especially pushing Pain does not radiate. Does not occur at rest. No shortness of breath or wheezing. Can take deep breath without pain. She has been seen several times for this in the past Thinks its due to heavy lifting and pushing at her job. Increased hours recently.  She is requesting light duty  Seen by PCP on 6/7 who prescribed Robaxin although patient reports was not at pharmacy  She has been using ibuprofen  Past Medical History:  Diagnosis Date   Abdominal pain    Abscess of left external ear    Acute gastritis without hemorrhage    Chest pain    Chest wall pain    Costochondritis    Dysmenorrhea    Epigastric abdominal pain    Headache    Near syncope    Other iron deficiency anemia    Rash    Shoulder pain    Sore throat    Strep pharyngitis    Tinea corporis     Patient Active Problem List   Diagnosis Date Noted   Precordial chest pain 01/12/2023    Past Surgical History:  Procedure Laterality Date   TUBAL LIGATION Bilateral 2020    OB History   No obstetric history on file.      Home Medications    Prior to Admission medications   Medication Sig Start Date End Date Taking? Authorizing Provider  ibuprofen (ADVIL) 600 MG tablet Take 1 tablet (600 mg total) by mouth every 8 (eight) hours as needed. Take with food. 04/10/23  Yes Marcine Matar, MD  methocarbamol (ROBAXIN) 500 MG tablet Take 1 tablet (500 mg total) by mouth 2 (two) times daily as needed for muscle spasms. 04/22/23  Yes Jermany Rimel, Lurena Joiner, PA-C  Multiple Vitamin (MULTIVITAMIN) tablet Take 1 tablet by mouth daily.   Yes [provider]    Family History Family History  Problem  Relation Age of Onset   Drug abuse Father     Social History Social History   Tobacco Use   Smoking status: Never   Smokeless tobacco: Never  Vaping Use   Vaping Use: Never used  Substance Use Topics   Alcohol use: Not Currently   Drug use: Not Currently    Types: Marijuana     Allergies   Patient has no known allergies.   Review of Systems Review of Systems As per HPI  Physical Exam Triage Vital Signs ED Triage Vitals  Enc Vitals Group     BP 04/22/23 0855 114/77     Pulse Rate 04/22/23 0855 73     Resp 04/22/23 0855 16     Temp 04/22/23 0855 98.4 F (36.9 C)     Temp Source 04/22/23 0855 Oral     SpO2 04/22/23 0855 99 %     Weight --      Height --      Head Circumference --      Peak Flow --      Pain Score 04/22/23 0856 10     Pain Loc --      Pain Edu? --      Excl. in GC? --  No data found.  Updated Vital Signs BP 114/77   Pulse 73   Temp 98.4 F (36.9 C) (Oral)   Resp 16   LMP 03/28/2023 (Exact Date)   SpO2 99%    Physical Exam Vitals and nursing note reviewed.  Constitutional:      General: She is not in acute distress.    Appearance: She is not ill-appearing.  HENT:     Mouth/Throat:     Pharynx: Oropharynx is clear.  Cardiovascular:     Rate and Rhythm: Normal rate and regular rhythm.     Pulses: Normal pulses.     Heart sounds: Normal heart sounds. No murmur heard. Pulmonary:     Effort: Pulmonary effort is normal. No respiratory distress.     Breath sounds: No wheezing.     Comments: Clear sounds throughout  Chest:     Chest wall: Tenderness present. No mass, deformity, swelling, crepitus or edema.       Comments: Reproducible tenderness. No rib tenderness or palpable deformity.  Abdominal:     Tenderness: There is no abdominal tenderness.  Musculoskeletal:     Cervical back: Normal range of motion.  Skin:    Capillary Refill: Capillary refill takes less than 2 seconds.     Comments: No bruising or ecchymosis    Neurological:     Mental Status: She is alert and oriented to person, place, and time.     UC Treatments / Results  Labs (all labs ordered are listed, but only abnormal results are displayed) Labs Reviewed - No data to display  EKG  Radiology No results found.  Procedures Procedures   Medications Ordered in UC Medications - No data to display  Initial Impression / Assessment and Plan / UC Course  I have reviewed the triage vital signs and the nursing notes.  Pertinent labs & imaging results that were available during my care of the patient were reviewed by me and considered in my medical decision making (see chart for details).  Reproducible chest wall pain. Patient believes this is muscular from her job. I have low concern for underlying cardiac or pulm etiology at this time. No indication for further workup or ED eval at this time.  Re-sent robaxin to use BID prn. She reports relief with this in the past.  Advised drowsy precautions.  Provided with employee health & wellness for work restrictions. Patient will call to set up appointment. Understands urgent care does not provide restrictions.  Return and ED precautions. No questions at this time.   Final Clinical Impressions(s) / UC Diagnoses   Final diagnoses:  Chest wall pain     Discharge Instructions      Call the Employee Health & Wellness clinic to schedule an appointment. They will be able to provide you with work restrictions.  You can take the muscle relaxer twice daily. If the medication makes you drowsy, take only at bed time. Continue ibuprofen/tylenol as needed. Try hot pad or ice     ED Prescriptions     Medication Sig Dispense Auth. Provider   methocarbamol (ROBAXIN) 500 MG tablet Take 1 tablet (500 mg total) by mouth 2 (two) times daily as needed for muscle spasms. 20 tablet Alias Villagran, Lurena Joiner, PA-C      PDMP not reviewed this encounter.   Sindhu Nguyen, Lurena Joiner, PA-C 04/22/23 1002

## 2023-04-22 NOTE — Discharge Instructions (Addendum)
Call the Employee Health & Wellness clinic to schedule an appointment. They will be able to provide you with work restrictions.  You can take the muscle relaxer twice daily. If the medication makes you drowsy, take only at bed time. Continue ibuprofen/tylenol as needed. Try hot pad or ice

## 2023-04-22 NOTE — ED Triage Notes (Signed)
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 States this is the same pain she had as per visits on record in Feb 2024. Pt requesting work note for lighter duty. Pt states her PCP prescribed muscle relaxers last wk but she was unable to fill Rx; has been taking IBU as directed.

## 2023-06-04 DIAGNOSIS — U071 COVID-19: Secondary | ICD-10-CM | POA: Diagnosis not present

## 2023-06-04 DIAGNOSIS — R051 Acute cough: Secondary | ICD-10-CM | POA: Diagnosis not present

## 2023-07-17 ENCOUNTER — Encounter: Payer: Self-pay | Admitting: Internal Medicine

## 2023-07-18 ENCOUNTER — Other Ambulatory Visit: Payer: Self-pay | Admitting: Internal Medicine

## 2023-07-18 DIAGNOSIS — F32A Depression, unspecified: Secondary | ICD-10-CM

## 2023-07-27 ENCOUNTER — Telehealth: Payer: Self-pay | Admitting: Internal Medicine

## 2023-07-27 DIAGNOSIS — F4323 Adjustment disorder with mixed anxiety and depressed mood: Secondary | ICD-10-CM | POA: Diagnosis not present

## 2023-07-27 NOTE — Telephone Encounter (Signed)
-----   Message from Maunie Blas sent at 07/27/2023 11:17 AM EDT ----- Regarding: Psyquiatry Referral Sent Referral to [facility]   [address] [place], [place] [address] [phone] ----- Message ----- From: Marcine Matar, MD Sent: 07/18/2023   9:18 AM EDT To: Dionne Bucy  Referral submitted to [facility].

## 2023-08-03 DIAGNOSIS — F4323 Adjustment disorder with mixed anxiety and depressed mood: Secondary | ICD-10-CM | POA: Diagnosis not present

## 2023-08-10 DIAGNOSIS — F4323 Adjustment disorder with mixed anxiety and depressed mood: Secondary | ICD-10-CM | POA: Diagnosis not present

## 2023-08-20 DIAGNOSIS — F4323 Adjustment disorder with mixed anxiety and depressed mood: Secondary | ICD-10-CM | POA: Diagnosis not present

## 2023-08-24 DIAGNOSIS — F4323 Adjustment disorder with mixed anxiety and depressed mood: Secondary | ICD-10-CM | POA: Diagnosis not present

## 2023-09-03 DIAGNOSIS — F4323 Adjustment disorder with mixed anxiety and depressed mood: Secondary | ICD-10-CM | POA: Diagnosis not present

## 2023-09-09 DIAGNOSIS — F4323 Adjustment disorder with mixed anxiety and depressed mood: Secondary | ICD-10-CM | POA: Diagnosis not present

## 2023-09-16 DIAGNOSIS — F4323 Adjustment disorder with mixed anxiety and depressed mood: Secondary | ICD-10-CM | POA: Diagnosis not present

## 2023-09-23 DIAGNOSIS — F4323 Adjustment disorder with mixed anxiety and depressed mood: Secondary | ICD-10-CM | POA: Diagnosis not present

## 2023-09-28 DIAGNOSIS — F4323 Adjustment disorder with mixed anxiety and depressed mood: Secondary | ICD-10-CM | POA: Diagnosis not present

## 2023-10-05 DIAGNOSIS — F4323 Adjustment disorder with mixed anxiety and depressed mood: Secondary | ICD-10-CM | POA: Diagnosis not present

## 2023-10-12 DIAGNOSIS — F4323 Adjustment disorder with mixed anxiety and depressed mood: Secondary | ICD-10-CM | POA: Diagnosis not present

## 2023-10-19 DIAGNOSIS — F4323 Adjustment disorder with mixed anxiety and depressed mood: Secondary | ICD-10-CM | POA: Diagnosis not present

## 2023-10-26 DIAGNOSIS — F4323 Adjustment disorder with mixed anxiety and depressed mood: Secondary | ICD-10-CM | POA: Diagnosis not present

## 2023-11-02 DIAGNOSIS — F4323 Adjustment disorder with mixed anxiety and depressed mood: Secondary | ICD-10-CM | POA: Diagnosis not present

## 2023-11-10 DIAGNOSIS — Z113 Encounter for screening for infections with a predominantly sexual mode of transmission: Secondary | ICD-10-CM | POA: Diagnosis not present

## 2023-11-10 DIAGNOSIS — Z862 Personal history of diseases of the blood and blood-forming organs and certain disorders involving the immune mechanism: Secondary | ICD-10-CM | POA: Diagnosis not present

## 2023-11-10 DIAGNOSIS — Z13220 Encounter for screening for lipoid disorders: Secondary | ICD-10-CM | POA: Diagnosis not present

## 2023-11-10 DIAGNOSIS — Z131 Encounter for screening for diabetes mellitus: Secondary | ICD-10-CM | POA: Diagnosis not present

## 2023-11-10 DIAGNOSIS — R109 Unspecified abdominal pain: Secondary | ICD-10-CM | POA: Diagnosis not present

## 2023-11-11 DIAGNOSIS — F4323 Adjustment disorder with mixed anxiety and depressed mood: Secondary | ICD-10-CM | POA: Diagnosis not present

## 2023-11-19 DIAGNOSIS — F4323 Adjustment disorder with mixed anxiety and depressed mood: Secondary | ICD-10-CM | POA: Diagnosis not present

## 2023-11-27 DIAGNOSIS — K0889 Other specified disorders of teeth and supporting structures: Secondary | ICD-10-CM | POA: Diagnosis not present

## 2023-12-01 DIAGNOSIS — F4323 Adjustment disorder with mixed anxiety and depressed mood: Secondary | ICD-10-CM | POA: Diagnosis not present

## 2023-12-09 DIAGNOSIS — N898 Other specified noninflammatory disorders of vagina: Secondary | ICD-10-CM | POA: Diagnosis not present

## 2023-12-09 DIAGNOSIS — N760 Acute vaginitis: Secondary | ICD-10-CM | POA: Diagnosis not present

## 2023-12-09 DIAGNOSIS — F4323 Adjustment disorder with mixed anxiety and depressed mood: Secondary | ICD-10-CM | POA: Diagnosis not present

## 2023-12-09 DIAGNOSIS — Z202 Contact with and (suspected) exposure to infections with a predominantly sexual mode of transmission: Secondary | ICD-10-CM | POA: Diagnosis not present

## 2023-12-22 DIAGNOSIS — F4323 Adjustment disorder with mixed anxiety and depressed mood: Secondary | ICD-10-CM | POA: Diagnosis not present

## 2023-12-30 DIAGNOSIS — F4323 Adjustment disorder with mixed anxiety and depressed mood: Secondary | ICD-10-CM | POA: Diagnosis not present

## 2024-01-06 DIAGNOSIS — F411 Generalized anxiety disorder: Secondary | ICD-10-CM | POA: Diagnosis not present

## 2024-01-16 DIAGNOSIS — R040 Epistaxis: Secondary | ICD-10-CM | POA: Diagnosis not present

## 2024-01-18 DIAGNOSIS — M791 Myalgia, unspecified site: Secondary | ICD-10-CM | POA: Diagnosis not present

## 2024-01-18 DIAGNOSIS — R0981 Nasal congestion: Secondary | ICD-10-CM | POA: Diagnosis not present

## 2024-01-18 DIAGNOSIS — J069 Acute upper respiratory infection, unspecified: Secondary | ICD-10-CM | POA: Diagnosis not present

## 2024-01-18 DIAGNOSIS — R059 Cough, unspecified: Secondary | ICD-10-CM | POA: Diagnosis not present

## 2024-03-15 DIAGNOSIS — F411 Generalized anxiety disorder: Secondary | ICD-10-CM | POA: Diagnosis not present

## 2024-06-25 DIAGNOSIS — M940 Chondrocostal junction syndrome [Tietze]: Secondary | ICD-10-CM | POA: Diagnosis not present

## 2024-06-25 DIAGNOSIS — R0789 Other chest pain: Secondary | ICD-10-CM | POA: Diagnosis not present
